# Patient Record
Sex: Female | Born: 1975 | Race: White | Hispanic: No | Marital: Single | Smoking: Never smoker
Health system: Southern US, Community
[De-identification: ages and names within clinical notes are randomized; demographics above are authoritative.]

## PROBLEM LIST (undated history)

## (undated) DIAGNOSIS — F419 Anxiety disorder, unspecified: Secondary | ICD-10-CM

## (undated) DIAGNOSIS — L709 Acne, unspecified: Secondary | ICD-10-CM

## (undated) DIAGNOSIS — S8290XA Unspecified fracture of unspecified lower leg, initial encounter for closed fracture: Secondary | ICD-10-CM

## (undated) DIAGNOSIS — M47812 Spondylosis without myelopathy or radiculopathy, cervical region: Secondary | ICD-10-CM

## (undated) HISTORY — DX: Unspecified fracture of unspecified lower leg, initial encounter for closed fracture: S82.90XA

## (undated) HISTORY — DX: Spondylosis without myelopathy or radiculopathy, cervical region: M47.812

## (undated) HISTORY — DX: Anxiety disorder, unspecified: F41.9

## (undated) HISTORY — DX: Acne, unspecified: L70.9

---

## 1997-05-05 HISTORY — PX: WISDOM TOOTH EXTRACTION: SHX21

## 2001-04-29 ENCOUNTER — Other Ambulatory Visit: Admission: RE | Admit: 2001-04-29 | Discharge: 2001-04-29 | Payer: Self-pay | Admitting: Obstetrics and Gynecology

## 2001-07-03 DIAGNOSIS — S8290XA Unspecified fracture of unspecified lower leg, initial encounter for closed fracture: Secondary | ICD-10-CM

## 2001-07-03 HISTORY — DX: Unspecified fracture of unspecified lower leg, initial encounter for closed fracture: S82.90XA

## 2001-08-27 ENCOUNTER — Encounter: Payer: Self-pay | Admitting: Family Medicine

## 2001-08-27 ENCOUNTER — Ambulatory Visit (HOSPITAL_COMMUNITY): Admission: RE | Admit: 2001-08-27 | Discharge: 2001-08-27 | Payer: Self-pay | Admitting: Family Medicine

## 2002-04-21 ENCOUNTER — Other Ambulatory Visit: Admission: RE | Admit: 2002-04-21 | Discharge: 2002-04-21 | Payer: Self-pay | Admitting: Gynecology

## 2003-04-25 ENCOUNTER — Other Ambulatory Visit: Admission: RE | Admit: 2003-04-25 | Discharge: 2003-04-25 | Payer: Self-pay | Admitting: Gynecology

## 2004-05-20 ENCOUNTER — Other Ambulatory Visit: Admission: RE | Admit: 2004-05-20 | Discharge: 2004-05-20 | Payer: Self-pay | Admitting: Gynecology

## 2005-05-21 ENCOUNTER — Other Ambulatory Visit: Admission: RE | Admit: 2005-05-21 | Discharge: 2005-05-21 | Payer: Self-pay | Admitting: Gynecology

## 2006-04-16 ENCOUNTER — Ambulatory Visit: Payer: Self-pay | Admitting: Internal Medicine

## 2006-04-16 LAB — CONVERTED CEMR LAB
BUN: 12 mg/dL (ref 6–23)
CO2: 26 meq/L (ref 19–32)
Calcium: 9.2 mg/dL (ref 8.4–10.5)
Chloride: 107 meq/L (ref 96–112)
Chol/HDL Ratio, serum: 4.1
Cholesterol: 218 mg/dL (ref 0–200)
Creatinine, Ser: 1.3 mg/dL — ABNORMAL HIGH (ref 0.4–1.2)
GFR calc non Af Amer: 51 mL/min
Glomerular Filtration Rate, Af Am: 61 mL/min/{1.73_m2}
Glucose, Bld: 82 mg/dL (ref 70–99)
HCT: 41 % (ref 36.0–46.0)
HDL: 53.1 mg/dL (ref >39.0)
Hemoglobin: 14.1 g/dL (ref 12.0–15.0)
LDL DIRECT: 162.2 mg/dL
MCHC: 34.3 g/dL (ref 30.0–36.0)
MCV: 86.5 fL (ref 78.0–100.0)
Platelets: 214 10*3/uL (ref 150–400)
Potassium: 3.8 meq/L (ref 3.5–5.1)
RBC: 4.74 M/uL (ref 3.87–5.11)
RDW: 11.9 % (ref 11.5–14.6)
Sodium: 139 meq/L (ref 135–145)
TSH: 2.17 microintl units/mL (ref 0.35–5.50)
Triglyceride fasting, serum: 74 mg/dL (ref 0–149)
VLDL: 15 mg/dL (ref 0–40)
WBC: 6.5 10*3/uL (ref 4.5–10.5)

## 2006-05-22 ENCOUNTER — Other Ambulatory Visit: Admission: RE | Admit: 2006-05-22 | Discharge: 2006-05-22 | Payer: Self-pay | Admitting: Gynecology

## 2007-05-24 ENCOUNTER — Other Ambulatory Visit: Admission: RE | Admit: 2007-05-24 | Discharge: 2007-05-24 | Payer: Self-pay | Admitting: Gynecology

## 2007-11-11 ENCOUNTER — Other Ambulatory Visit: Admission: RE | Admit: 2007-11-11 | Discharge: 2007-11-11 | Payer: Self-pay | Admitting: Gynecology

## 2008-05-24 ENCOUNTER — Encounter: Payer: Self-pay | Admitting: Women's Health

## 2008-05-24 ENCOUNTER — Other Ambulatory Visit: Admission: RE | Admit: 2008-05-24 | Discharge: 2008-05-24 | Payer: Self-pay | Admitting: Gynecology

## 2008-05-24 ENCOUNTER — Ambulatory Visit: Payer: Self-pay | Admitting: Women's Health

## 2009-05-25 ENCOUNTER — Ambulatory Visit: Payer: Self-pay | Admitting: Women's Health

## 2009-05-25 ENCOUNTER — Other Ambulatory Visit: Admission: RE | Admit: 2009-05-25 | Discharge: 2009-05-25 | Payer: Self-pay | Admitting: Gynecology

## 2010-05-30 ENCOUNTER — Ambulatory Visit
Admission: RE | Admit: 2010-05-30 | Discharge: 2010-05-30 | Payer: Self-pay | Source: Home / Self Care | Attending: Women's Health | Admitting: Women's Health

## 2010-05-30 ENCOUNTER — Other Ambulatory Visit: Payer: Self-pay | Admitting: Women's Health

## 2010-05-30 ENCOUNTER — Other Ambulatory Visit (HOSPITAL_COMMUNITY)
Admission: RE | Admit: 2010-05-30 | Discharge: 2010-05-30 | Disposition: A | Discharge status: Home / Self Care | Payer: Medicare HMO | Source: Ambulatory Visit | Attending: Gynecology | Admitting: Gynecology

## 2010-05-30 DIAGNOSIS — Z124 Encounter for screening for malignant neoplasm of cervix: Secondary | ICD-10-CM | POA: Insufficient documentation

## 2010-08-26 ENCOUNTER — Other Ambulatory Visit: Payer: Medicare HMO

## 2010-08-26 ENCOUNTER — Ambulatory Visit: Payer: Medicare HMO | Admitting: Gynecology

## 2010-10-25 ENCOUNTER — Ambulatory Visit: Payer: Medicare HMO | Admitting: Gynecology

## 2010-10-25 ENCOUNTER — Other Ambulatory Visit: Payer: Medicare HMO

## 2011-06-04 ENCOUNTER — Ambulatory Visit (INDEPENDENT_AMBULATORY_CARE_PROVIDER_SITE_OTHER): Payer: Managed Care, Other (non HMO) | Admitting: Women's Health

## 2011-06-04 ENCOUNTER — Encounter: Payer: Self-pay | Admitting: Women's Health

## 2011-06-04 ENCOUNTER — Other Ambulatory Visit: Payer: Self-pay | Admitting: Women's Health

## 2011-06-04 VITALS — BP 110/72 | Ht 63.25 in | Wt 136.0 lb

## 2011-06-04 DIAGNOSIS — B009 Herpesviral infection, unspecified: Secondary | ICD-10-CM

## 2011-06-04 DIAGNOSIS — Z01419 Encounter for gynecological examination (general) (routine) without abnormal findings: Secondary | ICD-10-CM

## 2011-06-04 DIAGNOSIS — IMO0001 Reserved for inherently not codable concepts without codable children: Secondary | ICD-10-CM

## 2011-06-04 DIAGNOSIS — Z309 Encounter for contraceptive management, unspecified: Secondary | ICD-10-CM

## 2011-06-04 LAB — URINALYSIS, ROUTINE W REFLEX MICROSCOPIC
Glucose, UA: NEGATIVE mg/dL
Leukocytes, UA: NEGATIVE
Specific Gravity, Urine: 1.01 (ref 1.005–1.030)
pH: 5.5 (ref 5.0–8.0)

## 2011-06-04 LAB — CBC WITH DIFFERENTIAL/PLATELET
Eosinophils Absolute: 0.1 10*3/uL (ref 0.0–0.7)
Hemoglobin: 13.1 g/dL (ref 12.0–15.0)
Lymphocytes Relative: 44 % (ref 12–46)
Lymphs Abs: 2.7 10*3/uL (ref 0.7–4.0)
MCH: 29.1 pg (ref 26.0–34.0)
Monocytes Relative: 7 % (ref 3–12)
Neutro Abs: 3 10*3/uL (ref 1.7–7.7)
Neutrophils Relative %: 47 % (ref 43–77)
RBC: 4.5 MIL/uL (ref 3.87–5.11)

## 2011-06-04 LAB — URINALYSIS, MICROSCOPIC ONLY
Casts: NONE SEEN
WBC, UA: NONE SEEN WBC/hpf (ref <3)

## 2011-06-04 MED ORDER — NORGESTREL-ETHINYL ESTRADIOL 0.3-30 MG-MCG PO TABS: 1.0000 | ORAL_TABLET | Freq: Every day | ORAL | Status: DC

## 2011-06-04 MED ORDER — VALACYCLOVIR HCL 500 MG PO TABS: 500.0000 mg | ORAL_TABLET | Freq: Two times a day (BID) | ORAL | Status: DC

## 2011-06-04 MED ORDER — ACYCLOVIR 5 % EX OINT: TOPICAL_OINTMENT | CUTANEOUS | Status: AC

## 2011-06-04 NOTE — Progress Notes (Signed)
Tiffany Reilly February 19, 1976 [id number]    History:    The patient presents for annual exam.  Monthly 5 day cycle on Lo/Ovral. Same partner. History of HSV with rare outbreaks. History of ASCUS with negative high-risk HPV in 2009 with normal Paps since. Complained of some increased headaches relates to stress.   Past medical history, past surgical history, family history and social history were all reviewed and documented in the EPIC chart.   ROS:  A  ROS was performed and pertinent positives and negatives are included in the history.  Exam:  Filed Vitals:   06/04/11 1509  BP: 110/72    General appearance:  Normal Head/Neck:  Normal, without cervical or supraclavicular adenopathy. Thyroid:  Symmetrical, normal in size, without palpable masses or nodularity. Respiratory  Effort:  Normal  Auscultation:  Clear without wheezing or rhonchi Cardiovascular  Auscultation:  Regular rate, without rubs, murmurs or gallops  Edema/varicosities:  Not grossly evident Abdominal  Soft,nontender, without masses, guarding or rebound.  Liver/spleen:  No organomegaly noted  Hernia:  None appreciated  Skin  Inspection:  Grossly normal  Palpation:  Grossly normal Neurologic/psychiatric  Orientation:  Normal with appropriate conversation.  Mood/affect:  Normal  Genitourinary    Breasts: Examined lying and sitting.     Right: Without masses, retractions, discharge or axillary adenopathy.     Left: Without masses, retractions, discharge or axillary adenopathy.   Inguinal/mons:  Normal without inguinal adenopathy  External genitalia:  Normal  BUS/Urethra/Skene's glands:  Normal  Bladder:  Normal  Vagina:  Normal  Cervix:  Normal  Uterus:  normal in size, shape and contour.  Midline and mobile  Adnexa/parametria:     Rt: Without masses or tenderness.   Lt: Without masses or tenderness.  Anus and perineum: Normal  Digital rectal exam: Normal sphincter tone without palpated masses or  tenderness  Assessment/Plan:  36 y.o. SWF G0 for annual exam.   Normal GYN exam on Lo/Ovral  Situational stressors HSV with rare outbreaks  Plan: Loovral prescription, proper use, slight risk for blood clots and strokes reviewed. SBE's, exercise, calcium rich diet, MVI daily encouraged. Raynelle Fanning Whitts name and number was given for counseling to deal with situational problems of job and relationship issues. Valtrex 500 by mouth twice a day when necessary, Zovirax ointment 5% when necessary prescriptions given with instructions. CBC, UAHarrington Challenger [facility], 3:56 PM 06/04/2011

## 2012-06-04 ENCOUNTER — Encounter: Payer: Self-pay | Admitting: Women's Health

## 2012-06-04 ENCOUNTER — Ambulatory Visit (INDEPENDENT_AMBULATORY_CARE_PROVIDER_SITE_OTHER): Payer: Managed Care, Other (non HMO) | Admitting: Women's Health

## 2012-06-04 VITALS — BP 116/68 | Ht 63.75 in | Wt 138.0 lb

## 2012-06-04 DIAGNOSIS — L708 Other acne: Secondary | ICD-10-CM

## 2012-06-04 DIAGNOSIS — L709 Acne, unspecified: Secondary | ICD-10-CM | POA: Insufficient documentation

## 2012-06-04 DIAGNOSIS — Z309 Encounter for contraceptive management, unspecified: Secondary | ICD-10-CM

## 2012-06-04 DIAGNOSIS — Z01419 Encounter for gynecological examination (general) (routine) without abnormal findings: Secondary | ICD-10-CM

## 2012-06-04 DIAGNOSIS — B009 Herpesviral infection, unspecified: Secondary | ICD-10-CM

## 2012-06-04 DIAGNOSIS — IMO0001 Reserved for inherently not codable concepts without codable children: Secondary | ICD-10-CM

## 2012-06-04 DIAGNOSIS — A609 Anogenital herpesviral infection, unspecified: Secondary | ICD-10-CM | POA: Insufficient documentation

## 2012-06-04 MED ORDER — NORGESTREL-ETHINYL ESTRADIOL 0.3-30 MG-MCG PO TABS: 1.0000 | ORAL_TABLET | Freq: Every day | ORAL | Status: DC

## 2012-06-04 MED ORDER — VALACYCLOVIR HCL 500 MG PO TABS: ORAL_TABLET | ORAL | Status: DC

## 2012-06-04 NOTE — Patient Instructions (Signed)

## 2012-06-04 NOTE — Progress Notes (Signed)
Tiffany Reilly 10-May-1975 [id number]    History:    The patient presents for annual exam.  Regular monthly cycle on Lo/Ovral without complaint. History of ascus with negative HR HPV 2009 with normal Paps after. 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 Same partner 5 years, undecided about children. Had elevated cholesterol, other labs normal at a health screening at work. Currently on Septra for acne per dermatologist,  Accutane in the past.   Past medical history, past surgical history, family history and social history were all reviewed and documented in the EPIC chart. 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 Healthy lifestyle, has had a heel spur which has prevented running.   ROS:  A  ROS was performed and pertinent positives and negatives are included in the history.  Exam:  Filed Vitals:   06/04/12 1524  BP: 116/68    General appearance:  Normal Head/Neck:  Normal, without cervical or supraclavicular adenopathy. Thyroid:  Symmetrical, normal in size, without palpable masses or nodularity. Respiratory  Effort:  Normal  Auscultation:  Clear without wheezing or rhonchi Cardiovascular  Auscultation:  Regular rate, without rubs, murmurs or gallops  Edema/varicosities:  Not grossly evident Abdominal  Soft,nontender, without masses, guarding or rebound.  Liver/spleen:  No organomegaly noted  Hernia:  None appreciated  Skin  Inspection:  Grossly normal  Palpation:  Grossly normal Neurologic/psychiatric  Orientation:  Normal with appropriate conversation.  Mood/affect:  Normal  Genitourinary    Breasts: Examined lying and sitting.     Right: Without masses, retractions, discharge or axillary adenopathy.     Left: Without masses, retractions, discharge or axillary adenopathy.   Inguinal/mons:  Normal without inguinal adenopathy  External genitalia:  Normal  BUS/Urethra/Skene's glands:  Normal  Bladder:  Normal  Vagina:  Normal  Cervix:   Normal  Uterus:   normal in size, shape and contour.  Midline and mobile  Adnexa/parametria:     Rt: Without masses or tenderness.   Lt: Without masses or tenderness.  Anus and perineum: Normal  Digital rectal exam: Normal sphincter tone without palpated masses or tenderness  Assessment/Plan:  37 y.o. SWF G0 for annual exam with no complaints.  Normal GYN exam on Lo ovral Rare HSV outbreaks Acne-dermatologist  Plan: Lo/Ovral prescription, proper use, slight risk for blood clots and strokes reviewed. Valtrex 500 twice daily for 3-5 days as needed prescription, proper use given and reviewed. SBE's, exercise, calcium rich diet, MVI daily encouraged. UA, Pap normal 2012, new screening guidelines reviewed. Instructed to fax labs from health screening for review.    Harrington Challenger [facility], 4:27 PM 06/04/2012

## 2013-03-10 ENCOUNTER — Other Ambulatory Visit: Payer: Self-pay

## 2013-06-08 ENCOUNTER — Encounter: Payer: Managed Care, Other (non HMO) | Admitting: Women's Health

## 2013-06-28 ENCOUNTER — Ambulatory Visit (INDEPENDENT_AMBULATORY_CARE_PROVIDER_SITE_OTHER): Payer: Managed Care, Other (non HMO) | Admitting: Women's Health

## 2013-06-28 ENCOUNTER — Encounter: Payer: Self-pay | Admitting: Women's Health

## 2013-06-28 VITALS — BP 120/80 | Ht 63.5 in | Wt 139.8 lb

## 2013-06-28 DIAGNOSIS — F418 Other specified anxiety disorders: Secondary | ICD-10-CM

## 2013-06-28 DIAGNOSIS — Z01419 Encounter for gynecological examination (general) (routine) without abnormal findings: Secondary | ICD-10-CM

## 2013-06-28 DIAGNOSIS — IMO0001 Reserved for inherently not codable concepts without codable children: Secondary | ICD-10-CM

## 2013-06-28 DIAGNOSIS — B009 Herpesviral infection, unspecified: Secondary | ICD-10-CM

## 2013-06-28 DIAGNOSIS — F341 Dysthymic disorder: Secondary | ICD-10-CM

## 2013-06-28 DIAGNOSIS — F411 Generalized anxiety disorder: Secondary | ICD-10-CM

## 2013-06-28 DIAGNOSIS — Z309 Encounter for contraceptive management, unspecified: Secondary | ICD-10-CM

## 2013-06-28 LAB — CBC WITH DIFFERENTIAL/PLATELET
Basophils Absolute: 0.1 10*3/uL (ref 0.0–0.1)
Basophils Relative: 1 % (ref 0–1)
EOS ABS: 0.2 10*3/uL (ref 0.0–0.7)
Eosinophils Relative: 2 % (ref 0–5)
HCT: 39 % (ref 36.0–46.0)
HEMOGLOBIN: 13.9 g/dL (ref 12.0–15.0)
LYMPHS ABS: 3.1 10*3/uL (ref 0.7–4.0)
Lymphocytes Relative: 37 % (ref 12–46)
MCH: 30.1 pg (ref 26.0–34.0)
MCHC: 35.6 g/dL (ref 30.0–36.0)
MCV: 84.4 fL (ref 78.0–100.0)
MONOS PCT: 6 % (ref 3–12)
Monocytes Absolute: 0.5 10*3/uL (ref 0.1–1.0)
NEUTROS ABS: 4.5 10*3/uL (ref 1.7–7.7)
NEUTROS PCT: 54 % (ref 43–77)
Platelets: 249 10*3/uL (ref 150–400)
RBC: 4.62 MIL/uL (ref 3.87–5.11)
RDW: 13.4 % (ref 11.5–15.5)
WBC: 8.3 10*3/uL (ref 4.0–10.5)

## 2013-06-28 MED ORDER — NORGESTREL-ETHINYL ESTRADIOL 0.3-30 MG-MCG PO TABS: 1.0000 | ORAL_TABLET | Freq: Every day | ORAL | Status: DC

## 2013-06-28 MED ORDER — VALACYCLOVIR HCL 500 MG PO TABS: ORAL_TABLET | ORAL | Status: DC

## 2013-06-28 MED ORDER — ALPRAZOLAM 0.25 MG PO TABS: 0.2500 mg | ORAL_TABLET | Freq: Every evening | ORAL | Status: DC | PRN

## 2013-06-28 NOTE — Patient Instructions (Signed)
Depression, Adult °Depression refers to feeling sad, low, down in the dumps, blue, gloomy, or empty. In general, there are two kinds of depression: °1. Depression that we all experience from time to time because of upsetting life experiences, including the loss of a job or the ending of a relationship (normal sadness or normal grief). This kind of depression is considered normal, is short lived, and resolves within a few days to 2 weeks. (Depression experienced after the loss of a loved one is called bereavement. Bereavement often lasts longer than 2 weeks but normally gets better with time.) °2. Clinical depression, which lasts longer than normal sadness or normal grief or interferes with your ability to function at home, at work, and in school. It also interferes with your personal relationships. It affects almost every aspect of your life. Clinical depression is an illness. °Symptoms of depression also can be caused by conditions other than normal sadness and grief or clinical depression. Examples of these conditions are listed as follows: °· Physical illness Some physical illnesses, including underactive thyroid gland (hypothyroidism), severe anemia, specific types of cancer, diabetes, uncontrolled seizures, heart and lung problems, strokes, and chronic pain are commonly associated with symptoms of depression. °· Side effects of some prescription medicine In some people, certain types of prescription medicine can cause symptoms of depression. °· Substance abuse Abuse of alcohol and illicit drugs can cause symptoms of depression. °SYMPTOMS °Symptoms of normal sadness and normal grief include the following: °· Feeling sad or crying for short periods of time. °· Not caring about anything (apathy). °· Difficulty sleeping or sleeping too much. °· No longer able to enjoy the things you used to enjoy. °· Desire to be by oneself all the time (social isolation). °· Lack of energy or motivation. °· Difficulty  concentrating or remembering. °· Change in appetite or weight. °· Restlessness or agitation. °Symptoms of clinical depression include the same symptoms of normal sadness or normal grief and also the following symptoms: °· Feeling sad or crying all the time. °· Feelings of guilt or worthlessness. °· Feelings of hopelessness or helplessness. °· Thoughts of suicide or the desire to harm yourself (suicidal ideation). °· Loss of touch with reality (psychotic symptoms). Seeing or hearing things that are not real (hallucinations) or having false beliefs about your life or the people around you (delusions and paranoia). °DIAGNOSIS  °The diagnosis of clinical depression usually is based on the severity and duration of the symptoms. Your caregiver also will ask you questions about your medical history and substance use to find out if physical illness, use of prescription medicine, or substance abuse is causing your depression. Your caregiver also may order blood tests. °TREATMENT  °Typically, normal sadness and normal grief do not require treatment. However, sometimes antidepressant medicine is prescribed for bereavement to ease the depressive symptoms until they resolve. °The treatment for clinical depression depends on the severity of your symptoms but typically includes antidepressant medicine, counseling with a mental health professional, or a combination of both. Your caregiver will help to determine what treatment is best for you. °Depression caused by physical illness usually goes away with appropriate medical treatment of the illness. If prescription medicine is causing depression, talk with your caregiver about stopping the medicine, decreasing the dose, or substituting another medicine. °Depression caused by abuse of alcohol or illicit drugs abuse goes away with abstinence from these substances. Some adults need professional help in order to stop drinking or using drugs. °SEEK IMMEDIATE CARE IF: °· You have   thoughts  about hurting yourself or others. °· You lose touch with reality (have psychotic symptoms). °· You are taking medicine for depression and have a serious side effect. °FOR MORE INFORMATION °National Alliance on Mental Illness: www.nami.org  °National Institute of Mental Health: www.nimh.nih.gov  °Document Released: 04/18/2000 Document Revised: 10/21/2011 Document Reviewed: 07/21/2011 °ExitCare® Patient Information ©2014 [employer]. °Generalized Anxiety Disorder °Generalized anxiety disorder (GAD) is a mental disorder. It interferes with life functions, including relationships, work, and school. °GAD is different from normal anxiety, which everyone experiences at some point in their lives in response to specific life events and activities. Normal anxiety actually helps us prepare for and get through these life events and activities. Normal anxiety goes away after the event or activity is over.  °GAD causes anxiety that is not necessarily related to specific events or activities. It also causes excess anxiety in proportion to specific events or activities. The anxiety associated with GAD is also difficult to control. GAD can vary from mild to severe. People with severe GAD can have intense waves of anxiety with physical symptoms (panic attacks).  °SYMPTOMS °The anxiety and worry associated with GAD are difficult to control. This anxiety and worry are related to many life events and activities and also occur more days than not for 6 months or longer. People with GAD also have three or more of the following symptoms (one or more in children): °· Restlessness.   °· Fatigue. °· Difficulty concentrating.   °· Irritability. °· Muscle tension. °· Difficulty sleeping or unsatisfying sleep. °DIAGNOSIS °GAD is diagnosed through an assessment by your caregiver. Your caregiver will ask you questions about your mood, physical symptoms, and events in your life. Your caregiver may ask you about your medical history and use of  alcohol or drugs, including prescription medications. Your caregiver may also do a physical exam and blood tests. Certain medical conditions and the use of certain substances can cause symptoms similar to those associated with GAD. Your caregiver may refer you to a mental health specialist for further evaluation. °TREATMENT °The following therapies are usually used to treat GAD:  °· Medication Antidepressant medication usually is prescribed for long-term daily control. Antianxiety medications may be added in severe cases, especially when panic attacks occur.   °· Talk therapy (psychotherapy) Certain types of talk therapy can be helpful in treating GAD by providing support, education, and guidance. A form of talk therapy called cognitive behavioral therapy can teach you healthy ways to think about and react to daily life events and activities. °· Stress management techniques These include yoga, meditation, and exercise and can be very helpful when they are practiced regularly. °A mental health specialist can help determine which treatment is best for you. Some people see improvement with one therapy. However, other people require a combination of therapies. °Document Released: 08/16/2012 Document Reviewed: 08/16/2012 °ExitCare® Patient Information ©2014 [employer]. ° °

## 2013-06-28 NOTE — Progress Notes (Signed)
Florence CannerKristina M Bechtold Sep 05, 1975 [id number]    History:    Presents for annual exam. Monthly cycle on Lo/Ovral. Ascus with negative HR HPV 2009 normal Paps after. Same partner greater than 6 years good relationship, no libido. HSV-2 rare outbreaks. States has increased anxiety, feeling depressed, not herself. Denies suicidal ideation.  Past medical history, past surgical history, family history and social history were all reviewed and documented in the EPIC chart. Works from home for [employer]. Mother, sister anxiety and depression. Desires no children.  ROS:  A  ROS was performed and pertinent positives and negatives are included.  Exam:  Filed Vitals:   06/28/13 1530  BP: 120/80    General appearance:  Normal Thyroid:  Symmetrical, normal in size, without palpable masses or nodularity. Respiratory  Auscultation:  Clear without wheezing or rhonchi Cardiovascular  Auscultation:  Regular rate, without rubs, murmurs or gallops  Edema/varicosities:  Not grossly evident Abdominal  Soft,nontender, without masses, guarding or rebound.  Liver/spleen:  No organomegaly noted  Hernia:  None appreciated  Skin  Inspection:  Grossly normal   Breasts: Examined lying and sitting.     Right: Without masses, retractions, discharge or axillary adenopathy.     Left: Without masses, retractions, discharge or axillary adenopathy. Gentitourinary   Inguinal/mons:  Normal without inguinal adenopathy  External genitalia:  Normal  BUS/Urethra/Skene's glands:  Normal  Vagina:  Normal  Cervix:  Normal  Uterus:   normal in size, shape and contour.  Midline and mobile  Adnexa/parametria:     Rt: Without masses or tenderness.   Lt: Without masses or tenderness.  Anus and perineum: Normal  Digital rectal exam: Normal sphincter tone without palpated masses or tenderness  Assessment/Plan:  38 y.o. SWF G0 for annual exam.     Anxiety/depression Normal GYN exam on Lo/Ovral HSV rare outbreaks  Plan: Raynelle FanningJulie  Whitt's  number given to call for counseling. Reviewed importance of regular exercise, leisure activities. Xanax 0.25 for anxiety/panic as needed, reviewed addictive properties, to use sparingly. Reviewed may need daily medication for anxiety and depression, to be decided after counseling. Lo/Ovral prescription, proper use, slight risk for blood clots and strokes reviewed. Other contraception reviewed, information on ParaGard and Mirena IUD given. Will check coverage and schedule with Dr. Lily PeerFernandez to place if so desires. Valtrex 500 twice daily for 3-5 days as needed prescription, proper use given and reviewed. SBE's, exercise, calcium rich diet, vitamin D 1000 daily encouraged. CBC, TSH, UA, Pap. Pap normal 2012, new screening guidelines reviewed.    Harrington ChallengerYOUNG,Geraldean Walen J Richland HsptlWHNP, 4:17 PM 06/28/2013

## 2013-06-29 ENCOUNTER — Encounter: Payer: Self-pay | Admitting: Women's Health

## 2013-06-29 ENCOUNTER — Other Ambulatory Visit (HOSPITAL_COMMUNITY)
Admission: RE | Admit: 2013-06-29 | Discharge: 2013-06-29 | Disposition: A | Discharge status: Home / Self Care | Payer: Managed Care, Other (non HMO) | Source: Ambulatory Visit | Attending: Gynecology | Admitting: Gynecology

## 2013-06-29 DIAGNOSIS — Z01419 Encounter for gynecological examination (general) (routine) without abnormal findings: Secondary | ICD-10-CM | POA: Insufficient documentation

## 2013-06-29 LAB — URINALYSIS W MICROSCOPIC + REFLEX CULTURE
Bacteria, UA: NONE SEEN
Bilirubin Urine: NEGATIVE
CASTS: NONE SEEN
Crystals: NONE SEEN
GLUCOSE, UA: NEGATIVE mg/dL
Ketones, ur: NEGATIVE mg/dL
LEUKOCYTES UA: NEGATIVE
Nitrite: NEGATIVE
PH: 5 (ref 5.0–8.0)
Protein, ur: NEGATIVE mg/dL
SPECIFIC GRAVITY, URINE: 1.02 (ref 1.005–1.030)
SQUAMOUS EPITHELIAL / LPF: NONE SEEN
Urobilinogen, UA: 0.2 mg/dL (ref 0.0–1.0)

## 2013-06-29 LAB — TSH: TSH: 2.539 u[IU]/mL (ref 0.350–4.500)

## 2013-06-29 NOTE — Addendum Note (Signed)
Addended by: Richardson ChiquitoWILKINSON, KARI S on: 06/29/2013 08:24 AM   Modules accepted: Orders

## 2013-07-22 ENCOUNTER — Telehealth: Payer: Self-pay | Admitting: *Deleted

## 2013-07-22 DIAGNOSIS — B009 Herpesviral infection, unspecified: Secondary | ICD-10-CM

## 2013-07-22 DIAGNOSIS — IMO0001 Reserved for inherently not codable concepts without codable children: Secondary | ICD-10-CM

## 2013-07-22 MED ORDER — VALACYCLOVIR HCL 500 MG PO TABS: ORAL_TABLET | ORAL | Status: DC

## 2013-07-22 MED ORDER — NORGESTREL-ETHINYL ESTRADIOL 0.3-30 MG-MCG PO TABS: 1.0000 | ORAL_TABLET | Freq: Every day | ORAL | Status: DC

## 2013-07-22 NOTE — Telephone Encounter (Signed)
Pt called requesting to have birth control pill and valtrex sent to mail order pharmacy. Also benefit checked for Mirena IUD, sent message to wendy about this

## 2013-08-01 ENCOUNTER — Telehealth: Payer: Self-pay | Admitting: Gynecology

## 2013-08-01 ENCOUNTER — Other Ambulatory Visit: Payer: Self-pay | Admitting: Gynecology

## 2013-08-01 DIAGNOSIS — Z3046 Encounter for surveillance of implantable subdermal contraceptive: Secondary | ICD-10-CM

## 2013-08-01 MED ORDER — LEVONORGESTREL 20 MCG/24HR IU IUD: INTRAUTERINE_SYSTEM | Freq: Once | INTRAUTERINE | Status: DC

## 2013-08-01 NOTE — Telephone Encounter (Signed)
08/01/13-LM VM at home that pt [employer] ins will cover the Mirena & insertion at 100%, no copay and that appt will be scheduled with JF her back up doctor with [facility]. PT to call first day of cycle. [facility]

## 2014-03-14 ENCOUNTER — Ambulatory Visit (INDEPENDENT_AMBULATORY_CARE_PROVIDER_SITE_OTHER): Payer: Managed Care, Other (non HMO) | Admitting: Family Medicine

## 2014-03-14 ENCOUNTER — Encounter: Payer: Self-pay | Admitting: Family Medicine

## 2014-03-14 VITALS — BP 112/71 | HR 98 | Ht 64.0 in | Wt 135.0 lb

## 2014-03-14 DIAGNOSIS — M5412 Radiculopathy, cervical region: Secondary | ICD-10-CM

## 2014-03-14 MED ORDER — PREDNISONE (PAK) 10 MG PO TABS: ORAL_TABLET | ORAL | Status: DC

## 2014-03-14 MED ORDER — HYDROCODONE-ACETAMINOPHEN 5-325 MG PO TABS: 1.0000 | ORAL_TABLET | Freq: Four times a day (QID) | ORAL | Status: DC | PRN

## 2014-03-14 MED ORDER — CYCLOBENZAPRINE HCL 10 MG PO TABS: 10.0000 mg | ORAL_TABLET | Freq: Three times a day (TID) | ORAL | Status: DC | PRN

## 2014-03-14 NOTE — Patient Instructions (Signed)
You have cervical radiculopathy (a pinched nerve in the neck). Prednisone 6 day dose pack to relieve irritation/inflammation of the nerve. Aleve 2 tabs twice a day with food for pain and inflammation - start day AFTER finishing prednisone if prescribed this. Flexeril three times a day as needed for muscle spasms (can make you sleepy - if so do not drive while taking this). Norco as needed for severe pain (no driving on this medicine). Consider cervical collar if severely painful. In future consider physical therapy for stretching, exercises, traction, and modalities. Heat 15 minutes at a time 3-4 times a day to help with spasms. Watch head position when on computers, texting, when sleeping in bed - should in line with back to prevent further nerve traction and irritation. If not improving we will consider an MRI. Call me in 1 week to let me know how you're doing.

## 2014-03-15 DIAGNOSIS — M5412 Radiculopathy, cervical region: Secondary | ICD-10-CM | POA: Insufficient documentation

## 2014-03-15 NOTE — Progress Notes (Addendum)
PCP: No primary care provider on file.  Subjective:   HPI: Patient is a 38 y.o. female here for right arm pain.  Patient denies known injury or trauma. States for over a month has had pain in right side of neck/shoulder with radiation down right arm. Tried ibuprofen, aleve, tylenol without benefit. Associated numbness and tingling. No bowel/bladder dysfunction.  Past Medical History  Diagnosis Date  . Asthma   . Acne   . Leg fracture 3-03    LEFT TIBIA    Current Outpatient Prescriptions on File Prior to Visit  Medication Sig Dispense Refill  . ALBUTEROL IN Inhale into the lungs.    . ALPRAZolam (XANAX) 0.25 MG tablet Take 1 tablet (0.25 mg total) by mouth at bedtime as needed for anxiety. 30 tablet 1  . CALCIUM PO Take by mouth.    . Multiple Vitamins-Minerals (MULTIVITAMIN PO) Take by mouth.    . norgestrel-ethinyl estradiol (LO/OVRAL,CRYSELLE) 0.3-30 MG-MCG tablet Take 1 tablet by mouth daily. 3 Package 3  . valACYclovir (VALTREX) 500 MG tablet Take daily 90 tablet 3   Current Facility-Administered Medications on File Prior to Visit  Medication Dose Route Frequency Provider Last Rate Last Dose  . levonorgestrel (MIRENA) 20 MCG/24HR IUD   Intrauterine Once Ok EdwardsJuan H Fernandez, MD        Past Surgical History  Procedure Laterality Date  . Wisdom tooth extraction  1999    No Known Allergies  History   Social History  . Marital Status: Single    Spouse Name: N/A    Number of Children: N/A  . Years of Education: N/A   Occupational History  . Not on file.   Social History Main Topics  . Smoking status: Never Smoker   . Smokeless tobacco: Never Used  . Alcohol Use: No  . Drug Use: No  . Sexual Activity: Not Currently    Birth Control/ Protection: Pill   Other Topics Concern  . Not on file   Social History Narrative    Family History  Problem Relation Age of Onset  . Diabetes Maternal Grandfather   . Heart disease Maternal Grandfather   . Heart attack  Maternal Grandfather     BP 112/71 mmHg  Pulse 98  Ht 5\' 4"  (1.626 m)  Wt 135 lb (61.236 kg)  BMI 23.16 kg/m2  Review of Systems: See HPI above.    Objective:  Physical Exam:  Gen: NAD  Neck: No gross deformity, swelling, bruising. TTP right cervical paraspinal region.  No midline/bony TTP. FROM neck - pain with right lateral rotation, extension and flexion. BUE strength 5/5.   Sensation intact to light touch.   2+ equal reflexes in triceps, biceps, brachioradialis tendons. Negative spurlings. NV intact distal BUEs.  Assessment & Plan:  1. Cervical radiculopathy - start with prednisone dose pack and transition to aleve.  Flexeril and norco as needed.  Heat for spasms.  Ergonomic issues discussed.  Call in 1 week to let me know how she's doing - consider MRI if not improving, PT if improving.  Addendum:  MRI reviewed and discussed with patient.  She does have prominent narrowing especially at C6-7 more on right than left - believe this is most likely the cause of her pain.  Due to combination of disc bulging and arthritis.  Discussed options - she would like to try physical therapy first before considering ESIs.  Would also like to try extended course of prednisone - sent in to her pharmacy.

## 2014-03-15 NOTE — Assessment & Plan Note (Signed)
start with prednisone dose pack and transition to aleve.  Flexeril and norco as needed.  Heat for spasms.  Ergonomic issues discussed.  Call in 1 week to let me know how she's doing - consider MRI if not improving, PT if improving.

## 2014-03-22 ENCOUNTER — Telehealth: Payer: Self-pay | Admitting: Family Medicine

## 2014-03-23 ENCOUNTER — Ambulatory Visit (HOSPITAL_BASED_OUTPATIENT_CLINIC_OR_DEPARTMENT_OTHER)
Admission: RE | Admit: 2014-03-23 | Discharge: 2014-03-23 | Disposition: A | Discharge status: Home / Self Care | Payer: Managed Care, Other (non HMO) | Source: Ambulatory Visit | Attending: Family Medicine | Admitting: Family Medicine

## 2014-03-23 DIAGNOSIS — M5032 Other cervical disc degeneration, mid-cervical region: Secondary | ICD-10-CM | POA: Diagnosis not present

## 2014-03-23 DIAGNOSIS — M542 Cervicalgia: Secondary | ICD-10-CM | POA: Diagnosis present

## 2014-03-23 DIAGNOSIS — M5412 Radiculopathy, cervical region: Secondary | ICD-10-CM

## 2014-03-23 IMAGING — CR DG CERVICAL SPINE COMPLETE 4+V
5 series · 5 of 5 positions shown · non-contrast
Comparison: None.

CLINICAL DATA: Neck pain radiating to the right shoulder and
scapula with some right hand numbness and tingling for 1 month, no
injury

EXAM:
CERVICAL SPINE  4+ VIEWS

[w c-spine lat]
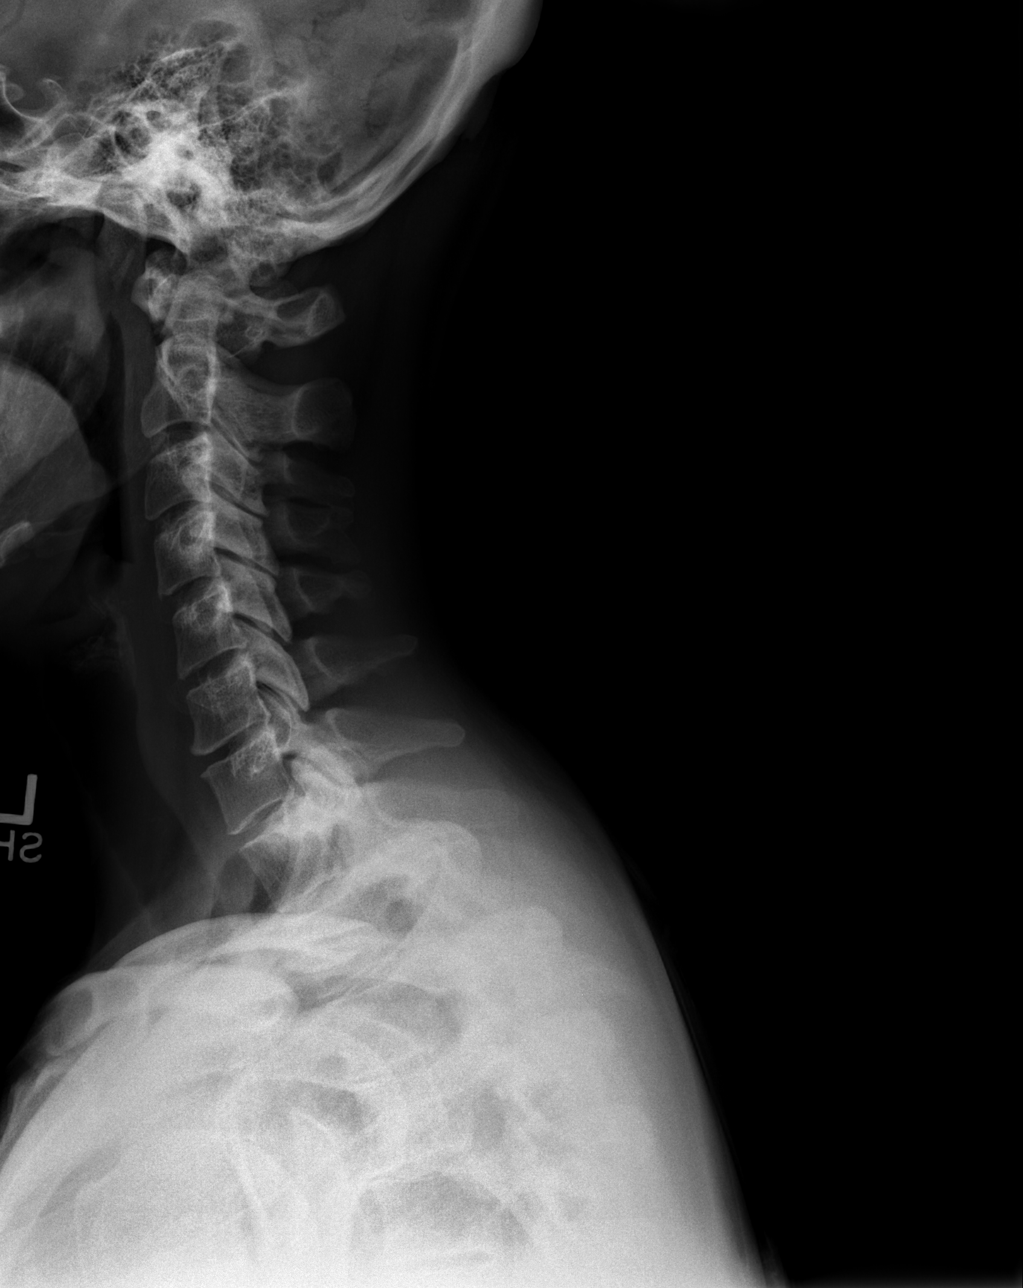

[w c-spine oblique (1 of 2)]
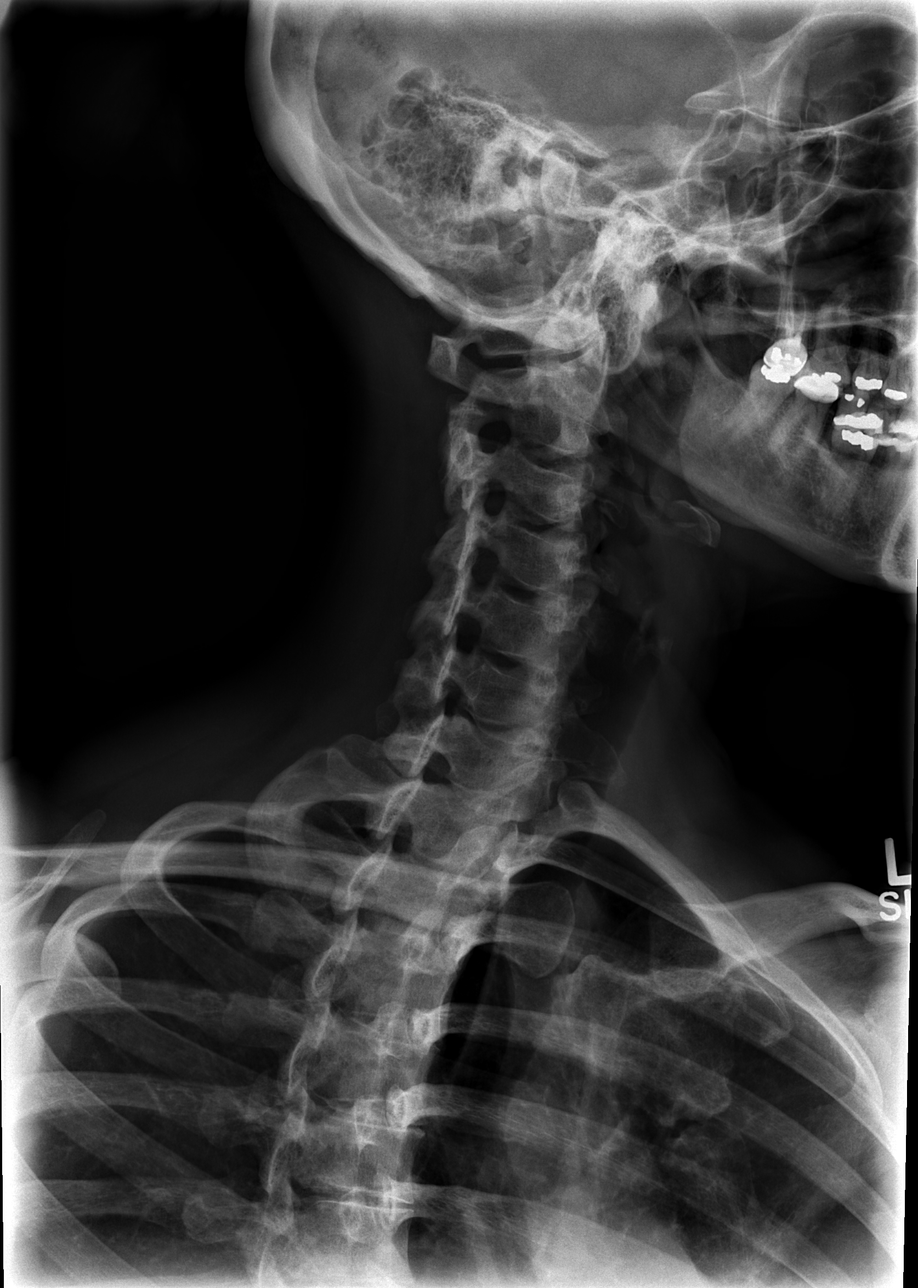

[w c-spine oblique (2 of 2)]
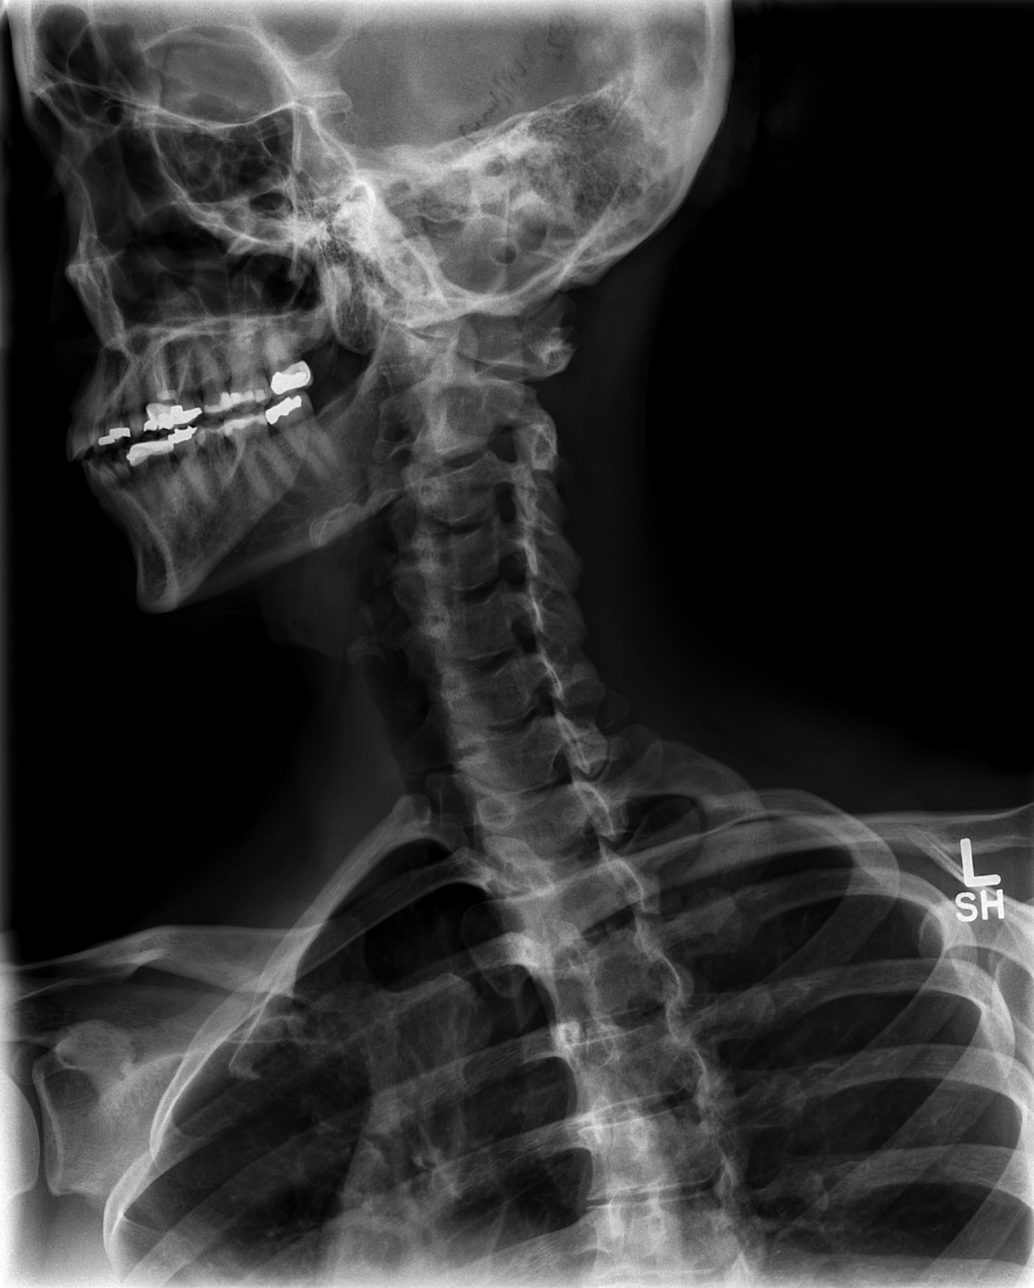

[w c-spine a.p.]
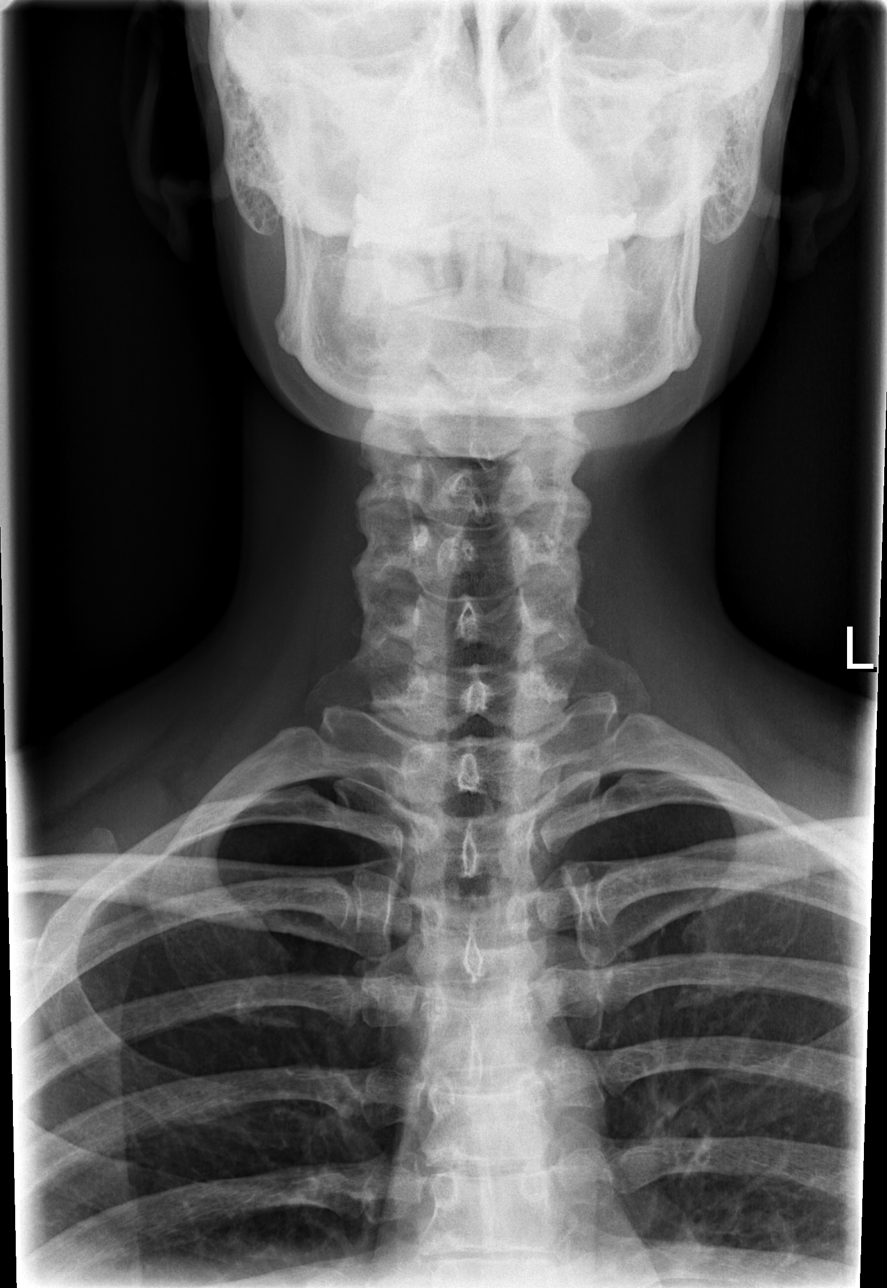

[w c-spine odontoid]
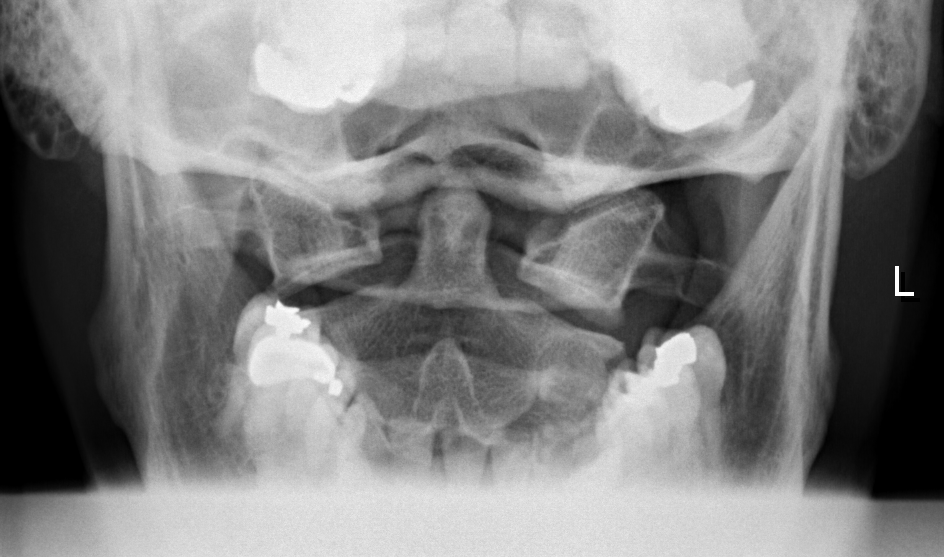

[5 of 5 positions shown; findings below may reference images not displayed]

FINDINGS: The cervical vertebrae are in normal alignment. There is mild
degenerative disc disease at C6-7 with there is slight loss of disc
space and mild sclerosis. No prevertebral soft tissue swelling is
seen. On oblique views, the foramina are patent. The odontoid
process is intact. The lung apices are clear.
IMPRESSION: Mild degenerative disc disease at C6-7.  Normal alignment.

## 2014-03-23 NOTE — Addendum Note (Signed)
Addended by: Kathi SimpersWISE, Coyt Govoni F on: 03/23/2014 11:16 AM   Modules accepted: Orders

## 2014-03-23 NOTE — Telephone Encounter (Signed)
Spoke with patient on 03-22-14 and she stated that she is only taking the Flexeril as needed. Has not taken the Norco since Sunday (03-19-14). Using heat but still having pain (6 out of 10).  Would like to know the next course of action.

## 2014-03-23 NOTE — Telephone Encounter (Signed)
Would recommend cervical spine x-rays followed by MRI of her cervical spine without contrast.  She could do physical therapy instead if she would like but that's usually if the patient is improving.

## 2014-03-24 NOTE — Addendum Note (Signed)
Addended by: Kathi SimpersWISE, Shahiem Bedwell F on: 03/24/2014 08:52 AM   Modules accepted: Orders

## 2014-03-25 ENCOUNTER — Ambulatory Visit (HOSPITAL_BASED_OUTPATIENT_CLINIC_OR_DEPARTMENT_OTHER)
Admission: RE | Admit: 2014-03-25 | Discharge: 2014-03-25 | Disposition: A | Discharge status: Home / Self Care | Payer: Managed Care, Other (non HMO) | Source: Ambulatory Visit | Attending: Family Medicine | Admitting: Family Medicine

## 2014-03-25 DIAGNOSIS — R200 Anesthesia of skin: Secondary | ICD-10-CM | POA: Diagnosis not present

## 2014-03-25 DIAGNOSIS — M542 Cervicalgia: Secondary | ICD-10-CM | POA: Insufficient documentation

## 2014-03-25 DIAGNOSIS — M47892 Other spondylosis, cervical region: Secondary | ICD-10-CM | POA: Diagnosis not present

## 2014-03-25 DIAGNOSIS — M5032 Other cervical disc degeneration, mid-cervical region: Secondary | ICD-10-CM | POA: Diagnosis not present

## 2014-03-25 DIAGNOSIS — M25511 Pain in right shoulder: Secondary | ICD-10-CM | POA: Insufficient documentation

## 2014-03-25 DIAGNOSIS — M5412 Radiculopathy, cervical region: Secondary | ICD-10-CM

## 2014-03-28 ENCOUNTER — Encounter: Payer: Self-pay | Admitting: *Deleted

## 2014-04-11 ENCOUNTER — Ambulatory Visit: Payer: Managed Care, Other (non HMO) | Admitting: Family Medicine

## 2014-04-11 MED ORDER — PREDNISONE (PAK) 10 MG PO TABS: ORAL_TABLET | ORAL | Status: DC

## 2014-04-11 NOTE — Addendum Note (Signed)
Addended by: Lenda KelpHUDNALL, Copper Kirtley R on: 04/11/2014 03:36 PM   Modules accepted: Orders, SmartSet

## 2014-04-11 NOTE — Addendum Note (Signed)
Addended by: Kathi SimpersWISE, Chavy Avera F on: 04/11/2014 03:46 PM   Modules accepted: Orders

## 2014-04-18 ENCOUNTER — Ambulatory Visit: Payer: Managed Care, Other (non HMO) | Admitting: Rehabilitation

## 2014-04-19 ENCOUNTER — Ambulatory Visit: Payer: Managed Care, Other (non HMO) | Attending: Family Medicine | Admitting: Rehabilitation

## 2014-04-19 DIAGNOSIS — M5412 Radiculopathy, cervical region: Secondary | ICD-10-CM | POA: Insufficient documentation

## 2014-04-25 ENCOUNTER — Ambulatory Visit: Payer: Managed Care, Other (non HMO) | Admitting: Rehabilitation

## 2014-04-25 DIAGNOSIS — M5412 Radiculopathy, cervical region: Secondary | ICD-10-CM | POA: Diagnosis not present

## 2014-04-27 ENCOUNTER — Ambulatory Visit: Payer: Managed Care, Other (non HMO)

## 2014-05-01 ENCOUNTER — Ambulatory Visit: Payer: Managed Care, Other (non HMO) | Admitting: Rehabilitation

## 2014-05-01 DIAGNOSIS — M5412 Radiculopathy, cervical region: Secondary | ICD-10-CM | POA: Diagnosis not present

## 2014-05-03 ENCOUNTER — Ambulatory Visit: Payer: Managed Care, Other (non HMO) | Admitting: Rehabilitation

## 2014-05-03 DIAGNOSIS — M5412 Radiculopathy, cervical region: Secondary | ICD-10-CM | POA: Diagnosis not present

## 2014-06-29 ENCOUNTER — Other Ambulatory Visit (HOSPITAL_COMMUNITY)
Admission: RE | Admit: 2014-06-29 | Discharge: 2014-06-29 | Disposition: A | Discharge status: Home / Self Care | Payer: Managed Care, Other (non HMO) | Source: Ambulatory Visit | Attending: Women's Health | Admitting: Women's Health

## 2014-06-29 ENCOUNTER — Ambulatory Visit (INDEPENDENT_AMBULATORY_CARE_PROVIDER_SITE_OTHER): Payer: Managed Care, Other (non HMO) | Admitting: Women's Health

## 2014-06-29 ENCOUNTER — Encounter: Payer: Self-pay | Admitting: Women's Health

## 2014-06-29 VITALS — BP 122/80 | Ht 63.5 in | Wt 135.0 lb

## 2014-06-29 DIAGNOSIS — Z01419 Encounter for gynecological examination (general) (routine) without abnormal findings: Secondary | ICD-10-CM | POA: Diagnosis not present

## 2014-06-29 DIAGNOSIS — Z3041 Encounter for surveillance of contraceptive pills: Secondary | ICD-10-CM

## 2014-06-29 DIAGNOSIS — B009 Herpesviral infection, unspecified: Secondary | ICD-10-CM

## 2014-06-29 DIAGNOSIS — Z1151 Encounter for screening for human papillomavirus (HPV): Secondary | ICD-10-CM | POA: Diagnosis present

## 2014-06-29 DIAGNOSIS — F411 Generalized anxiety disorder: Secondary | ICD-10-CM

## 2014-06-29 LAB — CBC WITH DIFFERENTIAL/PLATELET
Basophils Absolute: 0.1 10*3/uL (ref 0.0–0.1)
Basophils Relative: 1 % (ref 0–1)
EOS ABS: 0.1 10*3/uL (ref 0.0–0.7)
Eosinophils Relative: 1 % (ref 0–5)
HEMATOCRIT: 41 % (ref 36.0–46.0)
Hemoglobin: 13.9 g/dL (ref 12.0–15.0)
Lymphocytes Relative: 36 % (ref 12–46)
Lymphs Abs: 2.4 10*3/uL (ref 0.7–4.0)
MCH: 29.6 pg (ref 26.0–34.0)
MCHC: 33.9 g/dL (ref 30.0–36.0)
MCV: 87.4 fL (ref 78.0–100.0)
MONO ABS: 0.3 10*3/uL (ref 0.1–1.0)
MONOS PCT: 5 % (ref 3–12)
MPV: 9.4 fL (ref 8.6–12.4)
Neutro Abs: 3.9 10*3/uL (ref 1.7–7.7)
Neutrophils Relative %: 57 % (ref 43–77)
Platelets: 217 10*3/uL (ref 150–400)
RBC: 4.69 MIL/uL (ref 3.87–5.11)
RDW: 12.9 % (ref 11.5–15.5)
WBC: 6.8 10*3/uL (ref 4.0–10.5)

## 2014-06-29 MED ORDER — VALACYCLOVIR HCL 500 MG PO TABS: ORAL_TABLET | ORAL | Status: DC

## 2014-06-29 MED ORDER — ALPRAZOLAM 0.25 MG PO TABS: 0.2500 mg | ORAL_TABLET | Freq: Every evening | ORAL | Status: DC | PRN

## 2014-06-29 MED ORDER — NORGESTREL-ETHINYL ESTRADIOL 0.3-30 MG-MCG PO TABS: 1.0000 | ORAL_TABLET | Freq: Every day | ORAL | Status: DC

## 2014-06-29 NOTE — Patient Instructions (Signed)

## 2014-06-29 NOTE — Progress Notes (Signed)
Tiffany CannerKristina M Reilly 1975/09/22 [id number]    History:    Presents for annual exam.  Monthly cycle on Lo/Ovral. 2009 ascus with negative HR HPV. Desires no children. Same partner greater than 6 years. HSV-2 with rare outbreaks. Reports increased cholesterol, normal glucose at health screening at work. Struggling with anxiety.  Past medical history, past surgical history, family history and social history were all reviewed and documented in the EPIC chart. Works for [employer] from home. Mother, sister anxiety and depression.  ROS:  A ROS was performed and pertinent positives and negatives are included.  Exam:  Filed Vitals:   06/29/14 1514  BP: 122/80    General appearance:  Normal Thyroid:  Symmetrical, normal in size, without palpable masses or nodularity. Respiratory  Auscultation:  Clear without wheezing or rhonchi Cardiovascular  Auscultation:  Regular rate, without rubs, murmurs or gallops  Edema/varicosities:  Not grossly evident Abdominal  Soft,nontender, without masses, guarding or rebound.  Liver/spleen:  No organomegaly noted  Hernia:  None appreciated  Skin  Inspection:  Grossly normal   Breasts: Examined lying and sitting.     Right: Without masses, retractions, discharge or axillary adenopathy.     Left: Without masses, retractions, discharge or axillary adenopathy. Gentitourinary   Inguinal/mons:  Normal without inguinal adenopathy  External genitalia:  Normal  BUS/Urethra/Skene's glands:  Normal  Vagina:  Normal  Cervix:  Normal  Uterus:   normal in size, shape and contour.  Midline and mobile  Adnexa/parametria:     Rt: Without masses or tenderness.   Lt: Without masses or tenderness.  Anus and perineum: Normal  Digital rectal exam: Normal sphincter tone without palpated masses or tenderness  Assessment/Plan:  39 y.o. SWF G0 for annual exam.    Monthly cycle on Lo/Ovral Anxiety Reported increased cholesterol HSV 2-rare outbreaks  Plan: Lo ovral  prescription, proper use, slight risk for blood clots and strokes reviewed. Strongly encouraged counseling, Tiffany PandyJulie Reilly's name and number given, instructed to schedule counseling. Encouraged to continue regular exercise, leisure activities. Xanax 0.25 prescription, proper use, risk for addiction and overuse reviewed uses sparingly has she is less than one prescription in one year. Valtrex 500 twice daily for 3-5 days as needed, prescription, proper use given and reviewed. SBE's, annual screening mammogram at 40. Vitamin D 1000 daily encouraged. CBC, TSH, UA, Pap with HR HPV typing new screening guidelines reviewed.   Harrington ChallengerYOUNG,Tiffany Reilly J [facility] AledoWHNP, 5:13 PM 06/29/2014

## 2014-06-30 ENCOUNTER — Encounter: Payer: Self-pay | Admitting: Women's Health

## 2014-06-30 LAB — TSH: TSH: 4.301 u[IU]/mL (ref 0.350–4.500)

## 2014-07-03 LAB — CYTOLOGY - PAP

## 2014-07-04 ENCOUNTER — Encounter: Payer: Self-pay | Admitting: Women's Health

## 2014-07-04 ENCOUNTER — Telehealth: Payer: Self-pay | Admitting: *Deleted

## 2014-07-04 DIAGNOSIS — B009 Herpesviral infection, unspecified: Secondary | ICD-10-CM

## 2014-07-04 DIAGNOSIS — Z3041 Encounter for surveillance of contraceptive pills: Secondary | ICD-10-CM

## 2014-07-04 MED ORDER — VALACYCLOVIR HCL 500 MG PO TABS: ORAL_TABLET | ORAL | Status: DC

## 2014-07-04 MED ORDER — NORGESTREL-ETHINYL ESTRADIOL 0.3-30 MG-MCG PO TABS
1.0000 | ORAL_TABLET | Freq: Every day | ORAL | Status: DC
Start: 2014-07-04 — End: 2015-07-12

## 2014-07-04 NOTE — Telephone Encounter (Signed)
Pt called requesting birth control pills and valtrex sent to mail order pharmacy.

## 2015-07-03 ENCOUNTER — Encounter: Payer: Managed Care, Other (non HMO) | Admitting: Women's Health

## 2015-07-12 ENCOUNTER — Ambulatory Visit (INDEPENDENT_AMBULATORY_CARE_PROVIDER_SITE_OTHER): Payer: Managed Care, Other (non HMO) | Admitting: Women's Health

## 2015-07-12 ENCOUNTER — Encounter: Payer: Self-pay | Admitting: Women's Health

## 2015-07-12 VITALS — BP 118/74 | Ht 63.0 in | Wt 140.0 lb

## 2015-07-12 DIAGNOSIS — Z3041 Encounter for surveillance of contraceptive pills: Secondary | ICD-10-CM

## 2015-07-12 DIAGNOSIS — Z13220 Encounter for screening for lipoid disorders: Secondary | ICD-10-CM | POA: Diagnosis not present

## 2015-07-12 DIAGNOSIS — B009 Herpesviral infection, unspecified: Secondary | ICD-10-CM | POA: Diagnosis not present

## 2015-07-12 DIAGNOSIS — F411 Generalized anxiety disorder: Secondary | ICD-10-CM

## 2015-07-12 DIAGNOSIS — Z01419 Encounter for gynecological examination (general) (routine) without abnormal findings: Secondary | ICD-10-CM | POA: Diagnosis not present

## 2015-07-12 DIAGNOSIS — M542 Cervicalgia: Secondary | ICD-10-CM

## 2015-07-12 MED ORDER — NORGESTREL-ETHINYL ESTRADIOL 0.3-30 MG-MCG PO TABS: 1.0000 | ORAL_TABLET | Freq: Every day | ORAL | Status: DC

## 2015-07-12 MED ORDER — IBUPROFEN 600 MG PO TABS: 600.0000 mg | ORAL_TABLET | Freq: Three times a day (TID) | ORAL | Status: AC | PRN

## 2015-07-12 MED ORDER — VALACYCLOVIR HCL 500 MG PO TABS: ORAL_TABLET | ORAL | Status: DC

## 2015-07-12 MED ORDER — ALPRAZOLAM 0.25 MG PO TABS: 0.2500 mg | ORAL_TABLET | Freq: Every evening | ORAL | Status: DC | PRN

## 2015-07-12 MED ORDER — ESCITALOPRAM OXALATE 10 MG PO TABS: 10.0000 mg | ORAL_TABLET | Freq: Every day | ORAL | Status: DC

## 2015-07-12 NOTE — Patient Instructions (Addendum)
Health Maintenance, Female Adopting a healthy lifestyle and getting preventive care can go a long way to promote health and wellness. Talk with your health care provider about what schedule of regular examinations is right for you. This is a good chance for you to check in with your provider about disease prevention and staying healthy. In between checkups, there are plenty of things you can do on your own. Experts have done a lot of research about which lifestyle changes and preventive measures are most likely to keep you healthy. Ask your health care provider for more information. WEIGHT AND DIET  Eat a healthy diet  Be sure to include plenty of vegetables, fruits, low-fat dairy products, and lean protein.  Do not eat a lot of foods high in solid fats, added sugars, or salt.  Get regular exercise. This is one of the most important things you can do for your health.  Most adults should exercise for at least 150 minutes each week. The exercise should increase your heart rate and make you sweat (moderate-intensity exercise).  Most adults should also do strengthening exercises at least twice a week. This is in addition to the moderate-intensity exercise.  Maintain a healthy weight  Body mass index (BMI) is a measurement that can be used to identify possible weight problems. It estimates body fat based on height and weight. Your health care provider can help determine your BMI and help you achieve or maintain a healthy weight.  For females 20 years of age and older:   A BMI below 18.5 is considered underweight.  A BMI of 18.5 to 24.9 is normal.  A BMI of 25 to 29.9 is considered overweight.  A BMI of 30 and above is considered obese.  Watch levels of cholesterol and blood lipids  You should start having your blood tested for lipids and cholesterol at 41 years of age, then have this test every 5 years.  You may need to have your cholesterol levels checked more often if:  Your lipid  or cholesterol levels are high.  You are older than 41 years of age.  You are at high risk for heart disease.  CANCER SCREENING   Lung Cancer  Lung cancer screening is recommended for adults 55-80 years old who are at high risk for lung cancer because of a history of smoking.  A yearly low-dose CT scan of the lungs is recommended for people who:  Currently smoke.  Have quit within the past 15 years.  Have at least a 30-pack-year history of smoking. A pack year is smoking an average of one pack of cigarettes a day for 1 year.  Yearly screening should continue until it has been 15 years since you quit.  Yearly screening should stop if you develop a health problem that would prevent you from having lung cancer treatment.  Breast Cancer  Practice breast self-awareness. This means understanding how your breasts normally appear and feel.  It also means doing regular breast self-exams. Let your health care provider know about any changes, no matter how small.  If you are in your 20s or 30s, you should have a clinical breast exam (CBE) by a health care provider every 1-3 years as part of a regular health exam.  If you are 40 or older, have a CBE every year. Also consider having a breast X-ray (mammogram) every year.  If you have a family history of breast cancer, talk to your health care provider about genetic screening.  If you   are at high risk for breast cancer, talk to your health care provider about having an MRI and a mammogram every year.  Breast cancer gene (BRCA) assessment is recommended for women who have family members with BRCA-related cancers. BRCA-related cancers include:  Breast.  Ovarian.  Tubal.  Peritoneal cancers.  Results of the assessment will determine the need for genetic counseling and BRCA1 and BRCA2 testing. Cervical Cancer Your health care provider may recommend that you be screened regularly for cancer of the pelvic organs (ovaries, uterus, and  vagina). This screening involves a pelvic examination, including checking for microscopic changes to the surface of your cervix (Pap test). You may be encouraged to have this screening done every 3 years, beginning at age 21.  For women ages 30-65, health care providers may recommend pelvic exams and Pap testing every 3 years, or they may recommend the Pap and pelvic exam, combined with testing for human papilloma virus (HPV), every 5 years. Some types of HPV increase your risk of cervical cancer. Testing for HPV may also be done on women of any age with unclear Pap test results.  Other health care providers may not recommend any screening for nonpregnant women who are considered low risk for pelvic cancer and who do not have symptoms. Ask your health care provider if a screening pelvic exam is right for you.  If you have had past treatment for cervical cancer or a condition that could lead to cancer, you need Pap tests and screening for cancer for at least 20 years after your treatment. If Pap tests have been discontinued, your risk factors (such as having a new sexual partner) need to be reassessed to determine if screening should resume. Some women have medical problems that increase the chance of getting cervical cancer. In these cases, your health care provider may recommend more frequent screening and Pap tests. Colorectal Cancer  This type of cancer can be detected and often prevented.  Routine colorectal cancer screening usually begins at 41 years of age and continues through 41 years of age.  Your health care provider may recommend screening at an earlier age if you have risk factors for colon cancer.  Your health care provider may also recommend using home test kits to check for hidden blood in the stool.  A small camera at the end of a tube can be used to examine your colon directly (sigmoidoscopy or colonoscopy). This is done to check for the earliest forms of colorectal  cancer.  Routine screening usually begins at age 50.  Direct examination of the colon should be repeated every 5-10 years through 41 years of age. However, you may need to be screened more often if early forms of precancerous polyps or small growths are found. Skin Cancer  Check your skin from head to toe regularly.  Tell your health care provider about any new moles or changes in moles, especially if there is a change in a mole's shape or color.  Also tell your health care provider if you have a mole that is larger than the size of a pencil eraser.  Always use sunscreen. Apply sunscreen liberally and repeatedly throughout the day.  Protect yourself by wearing long sleeves, pants, a wide-brimmed hat, and sunglasses whenever you are outside. HEART DISEASE, DIABETES, AND HIGH BLOOD PRESSURE   High blood pressure causes heart disease and increases the risk of stroke. High blood pressure is more likely to develop in:  People who have blood pressure in the high end   of the normal range (130-139/85-89 mm Hg).  People who are overweight or obese.  People who are African American.  If you are 38-23 years of age, have your blood pressure checked every 3-5 years. If you are 61 years of age or older, have your blood pressure checked every year. You should have your blood pressure measured twice--once when you are at a hospital or clinic, and once when you are not at a hospital or clinic. Record the average of the two measurements. To check your blood pressure when you are not at a hospital or clinic, you can use:  An automated blood pressure machine at a pharmacy.  A home blood pressure monitor.  If you are between 45 years and 39 years old, ask your health care provider if you should take aspirin to prevent strokes.  Have regular diabetes screenings. This involves taking a blood sample to check your fasting blood sugar level.  If you are at a normal weight and have a low risk for diabetes,  have this test once every three years after 41 years of age.  If you are overweight and have a high risk for diabetes, consider being tested at a younger age or more often. PREVENTING INFECTION  Hepatitis B  If you have a higher risk for hepatitis B, you should be screened for this virus. You are considered at high risk for hepatitis B if:  You were born in a country where hepatitis B is common. Ask your health care provider which countries are considered high risk.  Your parents were born in a high-risk country, and you have not been immunized against hepatitis B (hepatitis B vaccine).  You have HIV or AIDS.  You use needles to inject street drugs.  You live with someone who has hepatitis B.  You have had sex with someone who has hepatitis B.  You get hemodialysis treatment.  You take certain medicines for conditions, including cancer, organ transplantation, and autoimmune conditions. Hepatitis C  Blood testing is recommended for:  Everyone born from [birth year] through 1966.  Anyone with known risk factors for hepatitis C. Sexually transmitted infections (STIs)  You should be screened for sexually transmitted infections (STIs) including gonorrhea and chlamydia if:  You are sexually active and are younger than 41 years of age.  You are older than 41 years of age and your health care provider tells you that you are at risk for this type of infection.  Your sexual activity has changed since you were last screened and you are at an increased risk for chlamydia or gonorrhea. Ask your health care provider if you are at risk.  If you do not have HIV, but are at risk, it may be recommended that you take a prescription medicine daily to prevent HIV infection. This is called pre-exposure prophylaxis (PrEP). You are considered at risk if:  You are sexually active and do not regularly use condoms or know the HIV status of your partner(s).  You take drugs by injection.  You are sexually  active with a partner who has HIV. Talk with your health care provider about whether you are at high risk of being infected with HIV. If you choose to begin PrEP, you should first be tested for HIV. You should then be tested every 3 months for as long as you are taking PrEP.  PREGNANCY   If you are premenopausal and you may become pregnant, ask your health care provider about preconception counseling.  If you may  become pregnant, take 400 to 800 micrograms (mcg) of folic acid every day.  If you want to prevent pregnancy, talk to your health care provider about birth control (contraception). OSTEOPOROSIS AND MENOPAUSE   Osteoporosis is a disease in which the bones lose minerals and strength with aging. This can result in serious bone fractures. Your risk for osteoporosis can be identified using a bone density scan.  If you are 107 years of age or older, or if you are at risk for osteoporosis and fractures, ask your health care provider if you should be screened.  Ask your health care provider whether you should take a calcium or vitamin D supplement to lower your risk for osteoporosis.  Menopause may have certain physical symptoms and risks.  Hormone replacement therapy may reduce some of these symptoms and risks. Talk to your health care provider about whether hormone replacement therapy is right for you.  HOME CARE INSTRUCTIONS   Schedule regular health, dental, and eye exams.  Stay current with your immunizations.   Do not use any tobacco products including cigarettes, chewing tobacco, or electronic cigarettes.  If you are pregnant, do not drink alcohol.  If you are breastfeeding, limit how much and how often you drink alcohol.  Limit alcohol intake to no more than 1 drink per day for nonpregnant women. One drink equals 12 ounces of beer, 5 ounces of wine, or 1 ounces of hard liquor.  Do not use street drugs.  Do not share needles.  Ask your health care provider for help if  you need support or information about quitting drugs.  Tell your health care provider if you often feel depressed.  Tell your health care provider if you have ever been abused or do not feel safe at home.   This information is not intended to replace advice given to you by your health care provider. Make sure you discuss any questions you have with your health care provider.   Document Released: 11/04/2010 Document Revised: 05/12/2014 Document Reviewed: 03/23/2013 Elsevier Interactive Patient Education 2016 Else[employer] Generalized Anxiety Disorder Generalized anxiety disorder (GAD) is a mental disorder. It interferes with life functions, including relationships, work, and school. GAD is different from normal anxiety, which everyone experiences at some point in their lives in response to specific life events and activities. Normal anxiety actually helps [place] prepare for and get through these life events and activities. Normal anxiety goes away after the event or activity is over.  GAD causes anxiety that is not necessarily related to specific events or activities. It also causes excess anxiety in proportion to specific events or activities. The anxiety associated with GAD is also difficult to control. GAD can vary from mild to severe. People with severe GAD can have intense waves of anxiety with physical symptoms (panic attacks).  SYMPTOMS The anxiety and worry associated with GAD are difficult to control. This anxiety and worry are related to many life events and activities and also occur more days than not for 6 months or longer. People with GAD also have three or more of the following symptoms (one or more in children):  Restlessness.   Fatigue.  Difficulty concentrating.   Irritability.  Muscle tension.  Difficulty sleeping or unsatisfying sleep. DIAGNOSIS GAD is diagnosed through an assessment by your health care provider. Your health care provider will ask you questions  aboutyour mood,physical symptoms, and events in your life. Your health care provider may ask you about your medical history and use of alcohol or  drugs, including prescription medicines. Your health care provider may also do a physical exam and blood tests. Certain medical conditions and the use of certain substances can cause symptoms similar to those associated with GAD. Your health care provider may refer you to a mental health specialist for further evaluation. TREATMENT The following therapies are usually used to treat GAD:   Medication. Antidepressant medication usually is prescribed for long-term daily control. Antianxiety medicines may be added in severe cases, especially when panic attacks occur.   Talk therapy (psychotherapy). Certain types of talk therapy can be helpful in treating GAD by providing support, education, and guidance. A form of talk therapy called cognitive behavioral therapy can teach you healthy ways to think about and react to daily life events and activities.  Stress managementtechniques. These include yoga, meditation, and exercise and can be very helpful when they are practiced regularly. A mental health specialist can help determine which treatment is best for you. Some people see improvement with one therapy. However, other people require a combination of therapies.   This information is not intended to replace advice given to you by your health care provider. Make sure you discuss any questions you have with your health care provider.   Document Released: 08/16/2012 Document Revised: 05/12/2014 Document Reviewed: 08/16/2012 Elsevier Interactive Patient Education 2016 [place]  [id number]

## 2015-07-12 NOTE — Progress Notes (Signed)
Tiffany Reilly 1975/09/11 [id number]    History:    Presents for annual exam.  Regular monthly cycle on Lo/Ovral occasional spotting week prior to cycle but much better cycle control on Lo/Ovral. Same partner years. HSV rare outbreaks. Always has had issues with anxiety has increased in the past. Related to job stress. Mother, sister with anxiety and depression. States home life is good but does stress over things that she know she should not. Will Pap history.  Past medical history, past surgical history, family history and social history were all reviewed and documented in the EPIC chart. Works for [employer].  ROS:  A ROS was performed and pertinent positives and negatives are included.  Exam:  Filed Vitals:   07/12/15 1519  BP: 118/74    General appearance:  Normal Thyroid:  Symmetrical, normal in size, without palpable masses or nodularity. Respiratory  Auscultation:  Clear without wheezing or rhonchi Cardiovascular  Auscultation:  Regular rate, without rubs, murmurs or gallops  Edema/varicosities:  Not grossly evident Abdominal  Soft,nontender, without masses, guarding or rebound.  Liver/spleen:  No organomegaly noted  Hernia:  None appreciated  Skin  Inspection:  Grossly normal   Breasts: Examined lying and sitting.     Right: Without masses, retractions, discharge or axillary adenopathy.     Left: Without masses, retractions, discharge or axillary adenopathy. Gentitourinary   Inguinal/mons:  Normal without inguinal adenopathy  External genitalia:  Normal  BUS/Urethra/Skene's glands:  Normal  Vagina:  Normal  Cervix:  Normal  Uterus:   normal in size, shape and contour.  Midline and mobile  Adnexa/parametria:     Rt: Without masses or tenderness.   Lt: Without masses or tenderness.  Anus and perineum: Normal  Digital rectal exam: Normal sphincter tone without palpated masses or tenderness  Assessment/Plan:  40 y.o. S WF G0 for annual exam.   Monthly cycle on  Lo/Ovral occasional spotting Anxiety HSV 2 rare outbreaks  Plan: Options reviewed would like to continue Lo/Ovral prescription, proper use, slight risk for blood clots and strokes reviewed. SBE's, annual screening mammogram breast center information given and reviewed instructed to schedule. Anxiety discussed, denies need for counseling would like to try a low-dose medication to help, options reviewed will try Lexapro 10 mg start with half tablet daily reviewed it is a low dose may need to increase to 20 mg daily prescription, proper use given, counseling offered again declines need. Continue regular exercise and leisure activities. CBC, CMP, TSH, lipid panel, UA, Pap normal 2016 with negative HR HPV new screenings reviewed.   Harrington ChallengerYOUNG,Mariyam Remington J [facility] LPWHNP, 5:48 PM 07/12/2015

## 2015-07-19 ENCOUNTER — Other Ambulatory Visit: Payer: Managed Care, Other (non HMO)

## 2015-12-24 ENCOUNTER — Other Ambulatory Visit: Payer: Self-pay | Admitting: *Deleted

## 2015-12-24 DIAGNOSIS — F411 Generalized anxiety disorder: Secondary | ICD-10-CM

## 2015-12-24 NOTE — Telephone Encounter (Signed)
Message left

## 2015-12-25 ENCOUNTER — Other Ambulatory Visit: Payer: Self-pay | Admitting: Women's Health

## 2015-12-25 DIAGNOSIS — F419 Anxiety disorder, unspecified: Principal | ICD-10-CM

## 2015-12-25 DIAGNOSIS — F329 Major depressive disorder, single episode, unspecified: Secondary | ICD-10-CM

## 2015-12-25 DIAGNOSIS — F32A Depression, unspecified: Secondary | ICD-10-CM

## 2015-12-25 MED ORDER — ESCITALOPRAM OXALATE 20 MG PO TABS: 20.0000 mg | ORAL_TABLET | Freq: Every day | ORAL | 2 refills | Status: DC

## 2015-12-25 NOTE — Telephone Encounter (Signed)
Message left, Lexapro 20 mg called in, received message stating doing better but think she needs a higher dose.

## 2015-12-26 NOTE — Telephone Encounter (Signed)
TC states breakup with long-term partner no infidelity Will increase Lexapro to 20 mg daily and if needed decrease to 10 mg after 6 months. Strongly encouraged counseling, continue self-care.

## 2016-02-27 ENCOUNTER — Encounter: Payer: Self-pay | Admitting: Women's Health

## 2016-07-24 ENCOUNTER — Ambulatory Visit (INDEPENDENT_AMBULATORY_CARE_PROVIDER_SITE_OTHER): Payer: Managed Care, Other (non HMO) | Admitting: Women's Health

## 2016-07-24 ENCOUNTER — Encounter: Payer: Self-pay | Admitting: Women's Health

## 2016-07-24 VITALS — BP 108/74 | Ht 63.5 in | Wt 130.6 lb

## 2016-07-24 DIAGNOSIS — F419 Anxiety disorder, unspecified: Secondary | ICD-10-CM

## 2016-07-24 DIAGNOSIS — Z3041 Encounter for surveillance of contraceptive pills: Secondary | ICD-10-CM | POA: Diagnosis not present

## 2016-07-24 DIAGNOSIS — F411 Generalized anxiety disorder: Secondary | ICD-10-CM | POA: Diagnosis not present

## 2016-07-24 DIAGNOSIS — F418 Other specified anxiety disorders: Secondary | ICD-10-CM

## 2016-07-24 DIAGNOSIS — N951 Menopausal and female climacteric states: Secondary | ICD-10-CM

## 2016-07-24 DIAGNOSIS — Z01419 Encounter for gynecological examination (general) (routine) without abnormal findings: Secondary | ICD-10-CM | POA: Diagnosis not present

## 2016-07-24 DIAGNOSIS — F329 Major depressive disorder, single episode, unspecified: Secondary | ICD-10-CM

## 2016-07-24 DIAGNOSIS — B009 Herpesviral infection, unspecified: Secondary | ICD-10-CM | POA: Diagnosis not present

## 2016-07-24 LAB — TSH: TSH: 3.35 m[IU]/L

## 2016-07-24 LAB — CBC WITH DIFFERENTIAL/PLATELET
Basophils Absolute: 0 {cells}/uL (ref 0–200)
Basophils Relative: 0 %
Eosinophils Absolute: 0 {cells}/uL — ABNORMAL LOW (ref 15–500)
Eosinophils Relative: 0 %
HCT: 40.1 % (ref 35.0–45.0)
Hemoglobin: 13.8 g/dL (ref 11.7–15.5)
Lymphocytes Relative: 40 %
Lymphs Abs: 3240 {cells}/uL (ref 850–3900)
MCH: 29.6 pg (ref 27.0–33.0)
MCHC: 34.4 g/dL (ref 32.0–36.0)
MCV: 86.1 fL (ref 80.0–100.0)
MPV: 9.7 fL (ref 7.5–12.5)
Monocytes Absolute: 405 {cells}/uL (ref 200–950)
Monocytes Relative: 5 %
Neutro Abs: 4455 {cells}/uL (ref 1500–7800)
Neutrophils Relative %: 55 %
Platelets: 204 K/uL (ref 140–400)
RBC: 4.66 MIL/uL (ref 3.80–5.10)
RDW: 13.2 % (ref 11.0–15.0)
WBC: 8.1 K/uL (ref 3.8–10.8)

## 2016-07-24 MED ORDER — ALPRAZOLAM 0.25 MG PO TABS: 0.2500 mg | ORAL_TABLET | Freq: Every evening | ORAL | 1 refills | Status: DC | PRN

## 2016-07-24 MED ORDER — NORGESTREL-ETHINYL ESTRADIOL 0.3-30 MG-MCG PO TABS: 1.0000 | ORAL_TABLET | Freq: Every day | ORAL | 4 refills | Status: DC

## 2016-07-24 MED ORDER — ESCITALOPRAM OXALATE 20 MG PO TABS: 20.0000 mg | ORAL_TABLET | Freq: Every day | ORAL | 2 refills | Status: DC

## 2016-07-24 MED ORDER — VALACYCLOVIR HCL 500 MG PO TABS: ORAL_TABLET | ORAL | 4 refills | Status: DC

## 2016-07-24 NOTE — Patient Instructions (Signed)
Health Maintenance, Female Adopting a healthy lifestyle and getting preventive care can go a long way to promote health and wellness. Talk with your health care provider about what schedule of regular examinations is right for you. This is a good chance for you to check in with your provider about disease prevention and staying healthy. In between checkups, there are plenty of things you can do on your own. Experts have done a lot of research about which lifestyle changes and preventive measures are most likely to keep you healthy. Ask your health care provider for more information. Weight and diet Eat a healthy diet  Be sure to include plenty of vegetables, fruits, low-fat dairy products, and lean protein.  Do not eat a lot of foods high in solid fats, added sugars, or salt.  Get regular exercise. This is one of the most important things you can do for your health.  Most adults should exercise for at least 150 minutes each week. The exercise should increase your heart rate and make you sweat (moderate-intensity exercise).  Most adults should also do strengthening exercises at least twice a week. This is in addition to the moderate-intensity exercise. Maintain a healthy weight  Body mass index (BMI) is a measurement that can be used to identify possible weight problems. It estimates body fat based on height and weight. Your health care provider can help determine your BMI and help you achieve or maintain a healthy weight.  For females 85 years of age and older:  A BMI below 18.5 is considered underweight.  A BMI of 18.5 to 24.9 is normal.  A BMI of 25 to 29.9 is considered overweight.  A BMI of 30 and above is considered obese. Watch levels of cholesterol and blood lipids  You should start having your blood tested for lipids and cholesterol at 42 years of age, then have this test every 5 years.  You may need to have your cholesterol levels checked more often if:  Your lipid or  cholesterol levels are high.  You are older than 42 years of age.  You are at high risk for heart disease. Cancer screening Lung Cancer  Lung cancer screening is recommended for adults 71-90 years old who are at high risk for lung cancer because of a history of smoking.  A yearly low-dose CT scan of the lungs is recommended for people who:  Currently smoke.  Have quit within the past 15 years.  Have at least a 30-pack-year history of smoking. A pack year is smoking an average of one pack of cigarettes a day for 1 year.  Yearly screening should continue until it has been 15 years since you quit.  Yearly screening should stop if you develop a health problem that would prevent you from having lung cancer treatment. Breast Cancer  Practice breast self-awareness. This means understanding how your breasts normally appear and feel.  It also means doing regular breast self-exams. Let your health care provider know about any changes, no matter how small.  If you are in your 20s or 30s, you should have a clinical breast exam (CBE) by a health care provider every 1-3 years as part of a regular health exam.  If you are 75 or older, have a CBE every year. Also consider having a breast X-ray (mammogram) every year.  If you have a family history of breast cancer, talk to your health care provider about genetic screening.  If you are at high risk for breast cancer, talk  to your health care provider about having an MRI and a mammogram every year.  Breast cancer gene (BRCA) assessment is recommended for women who have family members with BRCA-related cancers. BRCA-related cancers include:  Breast.  Ovarian.  Tubal.  Peritoneal cancers.  Results of the assessment will determine the need for genetic counseling and BRCA1 and BRCA2 testing. Cervical Cancer  Your health care provider may recommend that you be screened regularly for cancer of the pelvic organs (ovaries, uterus, and vagina).  This screening involves a pelvic examination, including checking for microscopic changes to the surface of your cervix (Pap test). You may be encouraged to have this screening done every 3 years, beginning at age 24.  For women ages 66-65, health care providers may recommend pelvic exams and Pap testing every 3 years, or they may recommend the Pap and pelvic exam, combined with testing for human papilloma virus (HPV), every 5 years. Some types of HPV increase your risk of cervical cancer. Testing for HPV may also be done on women of any age with unclear Pap test results.  Other health care providers may not recommend any screening for nonpregnant women who are considered low risk for pelvic cancer and who do not have symptoms. Ask your health care provider if a screening pelvic exam is right for you.  If you have had past treatment for cervical cancer or a condition that could lead to cancer, you need Pap tests and screening for cancer for at least 20 years after your treatment. If Pap tests have been discontinued, your risk factors (such as having a new sexual partner) need to be reassessed to determine if screening should resume. Some women have medical problems that increase the chance of getting cervical cancer. In these cases, your health care provider may recommend more frequent screening and Pap tests. Colorectal Cancer  This type of cancer can be detected and often prevented.  Routine colorectal cancer screening usually begins at 42 years of age and continues through 42 years of age.  Your health care provider may recommend screening at an earlier age if you have risk factors for colon cancer.  Your health care provider may also recommend using home test kits to check for hidden blood in the stool.  A small camera at the end of a tube can be used to examine your colon directly (sigmoidoscopy or colonoscopy). This is done to check for the earliest forms of colorectal cancer.  Routine  screening usually begins at age 41.  Direct examination of the colon should be repeated every 5-10 years through 42 years of age. However, you may need to be screened more often if early forms of precancerous polyps or small growths are found. Skin Cancer  Check your skin from head to toe regularly.  Tell your health care provider about any new moles or changes in moles, especially if there is a change in a mole's shape or color.  Also tell your health care provider if you have a mole that is larger than the size of a pencil eraser.  Always use sunscreen. Apply sunscreen liberally and repeatedly throughout the day.  Protect yourself by wearing long sleeves, pants, a wide-brimmed hat, and sunglasses whenever you are outside. Heart disease, diabetes, and high blood pressure  High blood pressure causes heart disease and increases the risk of stroke. High blood pressure is more likely to develop in:  People who have blood pressure in the high end of the normal range (130-139/85-89 mm Hg).  People who are overweight or obese.  People who are African American.  If you are 39-47 years of age, have your blood pressure checked every 3-5 years. If you are 18 years of age or older, have your blood pressure checked every year. You should have your blood pressure measured twice-once when you are at a hospital or clinic, and once when you are not at a hospital or clinic. Record the average of the two measurements. To check your blood pressure when you are not at a hospital or clinic, you can use:  An automated blood pressure machine at a pharmacy.  A home blood pressure monitor.  If you are between 19 years and 76 years old, ask your health care provider if you should take aspirin to prevent strokes.  Have regular diabetes screenings. This involves taking a blood sample to check your fasting blood sugar level.  If you are at a normal weight and have a low risk for diabetes, have this test once  every three years after 42 years of age.  If you are overweight and have a high risk for diabetes, consider being tested at a younger age or more often. Preventing infection Hepatitis B  If you have a higher risk for hepatitis B, you should be screened for this virus. You are considered at high risk for hepatitis B if:  You were born in a country where hepatitis B is common. Ask your health care provider which countries are considered high risk.  Your parents were born in a high-risk country, and you have not been immunized against hepatitis B (hepatitis B vaccine).  You have HIV or AIDS.  You use needles to inject street drugs.  You live with someone who has hepatitis B.  You have had sex with someone who has hepatitis B.  You get hemodialysis treatment.  You take certain medicines for conditions, including cancer, organ transplantation, and autoimmune conditions. Hepatitis C  Blood testing is recommended for:  Everyone born from [birth year] through 1966.  Anyone with known risk factors for hepatitis C. Sexually transmitted infections (STIs)  You should be screened for sexually transmitted infections (STIs) including gonorrhea and chlamydia if:  You are sexually active and are younger than 42 years of age.  You are older than 42 years of age and your health care provider tells you that you are at risk for this type of infection.  Your sexual activity has changed since you were last screened and you are at an increased risk for chlamydia or gonorrhea. Ask your health care provider if you are at risk.  If you do not have HIV, but are at risk, it may be recommended that you take a prescription medicine daily to prevent HIV infection. This is called pre-exposure prophylaxis (PrEP). You are considered at risk if:  You are sexually active and do not regularly use condoms or know the HIV status of your partner(s).  You take drugs by injection.  You are sexually active with a partner  who has HIV. Talk with your health care provider about whether you are at high risk of being infected with HIV. If you choose to begin PrEP, you should first be tested for HIV. You should then be tested every 3 months for as long as you are taking PrEP. Pregnancy  If you are premenopausal and you may become pregnant, ask your health care provider about preconception counseling.  If you may become pregnant, take 400 to 800 micrograms (mcg) of folic acid  every day.  If you want to prevent pregnancy, talk to your health care provider about birth control (contraception). Osteoporosis and menopause  Osteoporosis is a disease in which the bones lose minerals and strength with aging. This can result in serious bone fractures. Your risk for osteoporosis can be identified using a bone density scan.  If you are 15 years of age or older, or if you are at risk for osteoporosis and fractures, ask your health care provider if you should be screened.  Ask your health care provider whether you should take a calcium or vitamin D supplement to lower your risk for osteoporosis.  Menopause may have certain physical symptoms and risks.  Hormone replacement therapy may reduce some of these symptoms and risks. Talk to your health care provider about whether hormone replacement therapy is right for you. Follow these instructions at home:  Schedule regular health, dental, and eye exams.  Stay current with your immunizations.  Do not use any tobacco products including cigarettes, chewing tobacco, or electronic cigarettes.  If you are pregnant, do not drink alcohol.  If you are breastfeeding, limit how much and how often you drink alcohol.  Limit alcohol intake to no more than 1 drink per day for nonpregnant women. One drink equals 12 ounces of beer, 5 ounces of wine, or 1 ounces of hard liquor.  Do not use street drugs.  Do not share needles.  Ask your health care provider for help if you need support  or information about quitting drugs.  Tell your health care provider if you often feel depressed.  Tell your health care provider if you have ever been abused or do not feel safe at home. This information is not intended to replace advice given to you by your health care provider. Make sure you discuss any questions you have with your health care provider. Document Released: 11/04/2010 Document Revised: 09/27/2015 Document Reviewed: 01/23/2015 Elsevier Interactive Patient Education  2017 [employer].

## 2016-07-24 NOTE — Progress Notes (Signed)
Florence CannerKristina M Azimi 03-23-1976 [id number]    History:    Presents for annual exam. Lo/Ovral continuously. Normal Pap and mammogram history. Anxiety and depression stable on Lexapro, rare Xanax use with flying. Long-term boyfriend breakup last year denies need for STD screening.  Past medical history, past surgical history, family history and social history were all reviewed and documented in the EPIC chart. Works for [employer] from  home. Sister and mother anxiety and depression.  ROS:  A ROS was performed and pertinent positives and negatives are included.  Exam:  Vitals:   07/24/16 1507  BP: 108/74  Weight: 130 lb 9.6 oz (59.2 kg)  Height: 5' 3.5" (1.613 m)   Body mass index is 22.77 kg/m.   General appearance:  Normal Thyroid:  Symmetrical, normal in size, without palpable masses or nodularity. Respiratory  Auscultation:  Clear without wheezing or rhonchi Cardiovascular  Auscultation:  Regular rate, without rubs, murmurs or gallops  Edema/varicosities:  Not grossly evident Abdominal  Soft,nontender, without masses, guarding or rebound.  Liver/spleen:  No organomegaly noted  Hernia:  None appreciated  Skin  Inspection:  Grossly normal   Breasts: Examined lying and sitting.     Right: Without masses, retractions, discharge or axillary adenopathy.     Left: Without masses, retractions, discharge or axillary adenopathy. Gentitourinary   Inguinal/mons:  Normal without inguinal adenopathy  External genitalia:  Normal  BUS/Urethra/Skene's glands:  Normal  Vagina:  Normal  Cervix:  Normal  Uterus:  normal in size, shape and contour.  Midline and mobile  Adnexa/parametria:     Rt: Without masses or tenderness.   Lt: Without masses or tenderness.  Anus and perineum: Normal  Digital rectal exam: Normal sphincter tone without palpated masses or tenderness  Assessment/Plan:  41 y.o.SWF G0  for annual exam with complaint of always being hot, hot flushes, poor sleep.      Lo/Ovral continuously rare cycles HSV rare outbreaks Anxiety and depression stable on Lexapro  Plan: Lo/Ovral prescription, proper use, slight risk for blood clots and strokes reviewed. Condoms encouraged if sexually active. SBE's, continue annual screening mammogram, calcium rich diet, vitamin D 1000 daily encouraged. Lexapro 20 mg by mouth daily prescription, proper use given and reviewed. Strongly encouraged counseling Raynelle FanningJulie Whitt's name and number given instructed to schedule.Xanax 0.25 by mouth when necessary, uses when flying, aware to use sparingly addictive. Reviewed importance of getting back to regular exercise routine, leisure activities, self-care encouraged. Valtrex 500 by mouth daily prescription, proper use given. Reviewed periodic use declines. CBC, FSH, TSH. Had health screening labs of glucose and lipid panel will fax to office.    Harrington ChallengerYOUNG,NANCY J [facility] LLCWHNP, 5:15 PM 07/24/2016

## 2016-07-25 ENCOUNTER — Telehealth: Payer: Self-pay | Admitting: *Deleted

## 2016-07-25 LAB — FOLLICLE STIMULATING HORMONE: FSH: 3.6 m[IU]/mL

## 2016-07-25 MED ORDER — VALACYCLOVIR HCL 1 G PO TABS: ORAL_TABLET | ORAL | 3 refills | Status: DC

## 2016-07-25 NOTE — Telephone Encounter (Signed)
Rx sent 

## 2016-07-25 NOTE — Telephone Encounter (Signed)
Pharmacy states Valacyclovir 500 mg is on back order, Valacyclovir 1 gram (partially scored) . Okay to switch Rx? Please advise

## 2016-07-25 NOTE — Telephone Encounter (Signed)
Okay to send in refill for the 1 g and she will cut in half. Thanks

## 2016-07-25 NOTE — Telephone Encounter (Signed)
Message left

## 2016-09-17 ENCOUNTER — Encounter: Payer: Self-pay | Admitting: Gynecology

## 2017-07-14 ENCOUNTER — Encounter: Payer: Managed Care, Other (non HMO) | Admitting: Women's Health

## 2017-07-27 ENCOUNTER — Encounter: Payer: Managed Care, Other (non HMO) | Admitting: Women's Health

## 2017-09-23 ENCOUNTER — Ambulatory Visit (INDEPENDENT_AMBULATORY_CARE_PROVIDER_SITE_OTHER): Payer: 59 | Admitting: Women's Health

## 2017-09-23 ENCOUNTER — Encounter: Payer: Self-pay | Admitting: Women's Health

## 2017-09-23 VITALS — BP 110/70 | Ht 64.0 in | Wt 127.0 lb

## 2017-09-23 DIAGNOSIS — F419 Anxiety disorder, unspecified: Secondary | ICD-10-CM

## 2017-09-23 DIAGNOSIS — F411 Generalized anxiety disorder: Secondary | ICD-10-CM | POA: Diagnosis not present

## 2017-09-23 DIAGNOSIS — F329 Major depressive disorder, single episode, unspecified: Secondary | ICD-10-CM | POA: Diagnosis not present

## 2017-09-23 DIAGNOSIS — Z3041 Encounter for surveillance of contraceptive pills: Secondary | ICD-10-CM | POA: Diagnosis not present

## 2017-09-23 DIAGNOSIS — Z01419 Encounter for gynecological examination (general) (routine) without abnormal findings: Secondary | ICD-10-CM | POA: Diagnosis not present

## 2017-09-23 DIAGNOSIS — Z1151 Encounter for screening for human papillomavirus (HPV): Secondary | ICD-10-CM | POA: Diagnosis not present

## 2017-09-23 MED ORDER — VALACYCLOVIR HCL 500 MG PO TABS: ORAL_TABLET | ORAL | 4 refills | Status: DC

## 2017-09-23 MED ORDER — NORGESTREL-ETHINYL ESTRADIOL 0.3-30 MG-MCG PO TABS: 1.0000 | ORAL_TABLET | Freq: Every day | ORAL | 4 refills | Status: DC

## 2017-09-23 MED ORDER — ALPRAZOLAM 0.25 MG PO TABS: 0.2500 mg | ORAL_TABLET | Freq: Every evening | ORAL | 1 refills | Status: DC | PRN

## 2017-09-23 MED ORDER — ESCITALOPRAM OXALATE 20 MG PO TABS: 20.0000 mg | ORAL_TABLET | Freq: Every day | ORAL | 4 refills | Status: DC

## 2017-09-23 NOTE — Patient Instructions (Signed)

## 2017-09-23 NOTE — Progress Notes (Signed)
Tiffany Reilly 07-01-1975 [id number]    History:    Presents for annual exam. Lo/Ovral continuously/amenorrheic. Normal pap and mammogram history. Mammogram overdue.  Elevated cholesterol (per her employer health screening). Takes fish oil tablets. No boyfriend or sexual encounters for at least 2 years. Denies need for STD screening.  Desires no children.  Past medical history, past surgical history, family history and social history were all reviewed and documented in the EPIC chart. Works for [employer] from home. Anxiety and depression stable on Lexapro with occasional use of Xanax (also used for public speaking or flying). Asthma, stable. HSV managed with PRN Valtrex, no recent breakouts. Sister and mother anxiety and depression. Sister high cholesterol. Enjoys frequent walks with her 43 year old Yorkie named [place].  ROS:  A ROS was performed and pertinent positives and negatives are included.  Exam:  Vitals:   09/23/17 1401  BP: 110/70  Weight: 127 lb (57.6 kg)  Height:  (1.626 m)   Body mass index is 21.8 kg/m.   General appearance:  Normal Thyroid:  Symmetrical, normal in size, without palpable masses or nodularity. Respiratory  Auscultation:  Clear without wheezing or rhonchi Cardiovascular  Auscultation:  Regular rate, without rubs, murmurs or gallops  Edema/varicosities:  Not grossly evident Abdominal  Soft,nontender, without masses, guarding or rebound.  Liver/spleen:  No organomegaly noted  Hernia:  None appreciated  Skin  Inspection:  Grossly normal   Breasts: Examined lying and sitting.     Right: Without masses, retractions, discharge or axillary adenopathy.     Left: Without masses, retractions, discharge or axillary adenopathy. Gentitourinary   Inguinal/mons:  Normal without inguinal adenopathy  External genitalia:  Normal  BUS/Urethra/Skene's glands:  Normal  Vagina:  Normal  Cervix:  Normal  Uterus:  normal in size, shape and contour.  Midline  and mobile  Adnexa/parametria:     Rt: Without masses or tenderness.   Lt: Without masses or tenderness.  Anus and perineum: Normal  Digital rectal exam: Normal sphincter tone without palpated masses or tenderness  Assessment/Plan:  42 y.o. SWF G0 for annual exam with no complaints. Appears well.  Lo/Ovral continuously rare cycles HSV rare breakouts Anxiety and depression stable on Lexapro and occasional Xanax Hyperlipidemia Labs-health screening at work  Plan: Lo/Ovral prescription, proper use, slight risk for blood clots and strokes reviewed. Lexapro 20 mg prescription, proper use reviewed. Vatrex 500 mg prescription, tablet twice daily for 3 to 5 days proper use given. Xanax 0.25 mg prescription, prn/proper use, use when flying, public speakiing, or increased anxiety, aware of addictive potential. Condoms encouraged if sexually active. SBE's, continue annual screening mammogram, calcium rich diet, vitamin D 1000 daily encouraged. Continue exercise and diet.  Provide office with copy of lab results. Continue fish oil tablets and discussed red yeast rice for hyperlipidemia. Schedule mammogram.  Pap with HR HPV typing, new screening guidelines reviewed.  Harrington Challenger [facility], 2:46 PM 09/23/2017

## 2017-09-24 LAB — PAP, TP IMAGING W/ HPV RNA, RFLX HPV TYPE 16,18/45: HPV DNA High Risk: NOT DETECTED

## 2018-03-04 ENCOUNTER — Encounter: Payer: Self-pay | Admitting: Women's Health

## 2018-09-29 ENCOUNTER — Encounter: Payer: 59 | Admitting: Women's Health

## 2018-10-01 ENCOUNTER — Other Ambulatory Visit: Payer: Self-pay

## 2018-10-04 ENCOUNTER — Encounter: Payer: Self-pay | Admitting: Women's Health

## 2018-10-04 ENCOUNTER — Other Ambulatory Visit: Payer: Self-pay

## 2018-10-04 ENCOUNTER — Ambulatory Visit (INDEPENDENT_AMBULATORY_CARE_PROVIDER_SITE_OTHER): Payer: 59 | Admitting: Women's Health

## 2018-10-04 VITALS — BP 122/80 | Ht 64.0 in | Wt 128.0 lb

## 2018-10-04 DIAGNOSIS — Z01411 Encounter for gynecological examination (general) (routine) with abnormal findings: Secondary | ICD-10-CM | POA: Diagnosis not present

## 2018-10-04 DIAGNOSIS — F329 Major depressive disorder, single episode, unspecified: Secondary | ICD-10-CM

## 2018-10-04 DIAGNOSIS — Z3041 Encounter for surveillance of contraceptive pills: Secondary | ICD-10-CM | POA: Diagnosis not present

## 2018-10-04 DIAGNOSIS — R51 Headache: Secondary | ICD-10-CM | POA: Diagnosis not present

## 2018-10-04 DIAGNOSIS — R519 Headache, unspecified: Secondary | ICD-10-CM | POA: Insufficient documentation

## 2018-10-04 DIAGNOSIS — Z01419 Encounter for gynecological examination (general) (routine) without abnormal findings: Secondary | ICD-10-CM

## 2018-10-04 DIAGNOSIS — F411 Generalized anxiety disorder: Secondary | ICD-10-CM | POA: Diagnosis not present

## 2018-10-04 DIAGNOSIS — F32A Depression, unspecified: Secondary | ICD-10-CM

## 2018-10-04 DIAGNOSIS — Z113 Encounter for screening for infections with a predominantly sexual mode of transmission: Secondary | ICD-10-CM

## 2018-10-04 MED ORDER — NORGESTREL-ETHINYL ESTRADIOL 0.3-30 MG-MCG PO TABS: 1.0000 | ORAL_TABLET | Freq: Every day | ORAL | 4 refills | Status: DC

## 2018-10-04 MED ORDER — VALACYCLOVIR HCL 500 MG PO TABS: ORAL_TABLET | ORAL | 4 refills | Status: DC

## 2018-10-04 MED ORDER — ESCITALOPRAM OXALATE 20 MG PO TABS: 20.0000 mg | ORAL_TABLET | Freq: Every day | ORAL | 4 refills | Status: DC

## 2018-10-04 MED ORDER — SUMATRIPTAN SUCCINATE 25 MG PO TABS: 25.0000 mg | ORAL_TABLET | ORAL | 1 refills | Status: DC | PRN

## 2018-10-04 MED ORDER — ALPRAZOLAM 0.25 MG PO TABS: 0.2500 mg | ORAL_TABLET | Freq: Every evening | ORAL | 3 refills | Status: DC | PRN

## 2018-10-04 NOTE — Patient Instructions (Signed)

## 2018-10-04 NOTE — Progress Notes (Signed)
Tiffany Reilly 06-Jan-1976 [id number]    History:    Presents for annual exam.  Monthly cycle on Lo/Ovral .  Normal Pap and mammogram history.  New partner.  On Lexapro doing better with anxiety and depression.  HSV no outbreaks.  Has always had headaches but has had more frequent in the past year denies migraines with aura.  Headaches random, 2 weekly, taking frequent ibuprofen and Tylenol.  Past medical history, past surgical history, family history and social history were all reviewed and documented in the EPIC chart.  Works for [employer] job.  Has a Yorkie  [place] who barks at all dogs.  Sister and mother anxiety and depression.  ROS:  A ROS was performed and pertinent positives and negatives are included.  Exam:  Vitals:   10/04/18 1534  BP: 122/80  Weight: 128 lb (58.1 kg)  Height: 5\' 4"  (1.626 m)   Body mass index is 21.97 kg/m.   General appearance:  Normal Thyroid:  Symmetrical, normal in size, without palpable masses or nodularity. Respiratory  Auscultation:  Clear without wheezing or rhonchi Cardiovascular  Auscultation:  Regular rate, without rubs, murmurs or gallops  Edema/varicosities:  Not grossly evident Abdominal  Soft,nontender, without masses, guarding or rebound.  Liver/spleen:  No organomegaly noted  Hernia:  None appreciated  Skin  Inspection:  Grossly normal   Breasts: Examined lying and sitting.     Right: Without masses, retractions, discharge or axillary adenopathy.     Left: Without masses, retractions, discharge or axillary adenopathy. Gentitourinary   Inguinal/mons:  Normal without inguinal adenopathy  External genitalia:  Normal  BUS/Urethra/Skene's glands:  Normal  Vagina:  Normal  Cervix:  Normal  Uterus:  normal in size, shape and contour.  Midline and mobile  Adnexa/parametria:     Rt: Without masses or tenderness.   Lt: Without masses or tenderness.  Anus and perineum: Normal  Digital rectal exam: Normal sphincter tone  without palpated masses or tenderness  Assessment/Plan:  43 y.o. S WF G0 for annual exam with no GYN complaints.  Monthly cycle on Lo ovral HSV no outbreaks Anxiety and depression stable on Lexapro STD screen  Plan: Lo Ovral prescription, proper use, slight risk for blood clots and strokes reviewed.  Encouraged condoms until permanent partner.  SBEs, continue annual 3D screening mammogram, calcium rich foods, MVI daily, continue regular exercise encouraged.  Lexapro 20 mg p.o. daily prescription, proper use given and reviewed.  Counseling as needed has had in the past.  Valtrex 500 twice daily for 3 to 5 days as needed prescription, proper use given and reviewed.  Encouraged to follow-up with primary care for frequent headaches, will try Imitrex 25 mg at onset of headache and repeat 1 tablet in 2 hours but no more than 2 tablets in a 24-hour period.  Verbalized understanding.  CBC, CMP, GC/chlamydia, HIV, RPR.  Pap normal 2019, new screening guidelines reviewed.    Harrington Challenger [facility], 4:55 PM 10/04/2018

## 2018-10-05 LAB — CBC WITH DIFFERENTIAL/PLATELET
Absolute Monocytes: 429 cells/uL (ref 200–950)
Basophils Absolute: 81 cells/uL (ref 0–200)
Basophils Relative: 1 %
Eosinophils Absolute: 57 cells/uL (ref 15–500)
Eosinophils Relative: 0.7 %
HCT: 35.9 % (ref 35.0–45.0)
Hemoglobin: 12.2 g/dL (ref 11.7–15.5)
Lymphs Abs: 2981 cells/uL (ref 850–3900)
MCH: 30 pg (ref 27.0–33.0)
MCHC: 34 g/dL (ref 32.0–36.0)
MCV: 88.2 fL (ref 80.0–100.0)
MPV: 10.3 fL (ref 7.5–12.5)
Monocytes Relative: 5.3 %
Neutro Abs: 4552 cells/uL (ref 1500–7800)
Neutrophils Relative %: 56.2 %
Platelets: 220 10*3/uL (ref 140–400)
RBC: 4.07 10*6/uL (ref 3.80–5.10)
RDW: 12.1 % (ref 11.0–15.0)
Total Lymphocyte: 36.8 %
WBC: 8.1 10*3/uL (ref 3.8–10.8)

## 2018-10-05 LAB — C. TRACHOMATIS/N. GONORRHOEAE RNA
C. trachomatis RNA, TMA: NOT DETECTED
N. gonorrhoeae RNA, TMA: NOT DETECTED

## 2018-10-05 LAB — COMPREHENSIVE METABOLIC PANEL
AG Ratio: 1.7 (calc) (ref 1.0–2.5)
ALT: 9 U/L (ref 6–29)
AST: 14 U/L (ref 10–30)
Albumin: 4.1 g/dL (ref 3.6–5.1)
Alkaline phosphatase (APISO): 39 U/L (ref 31–125)
BUN: 13 mg/dL (ref 7–25)
CO2: 26 mmol/L (ref 20–32)
Calcium: 9.3 mg/dL (ref 8.6–10.2)
Chloride: 105 mmol/L (ref 98–110)
Creat: 1.02 mg/dL (ref 0.50–1.10)
Globulin: 2.4 g/dL (calc) (ref 1.9–3.7)
Glucose, Bld: 79 mg/dL (ref 65–99)
Potassium: 3.8 mmol/L (ref 3.5–5.3)
Sodium: 138 mmol/L (ref 135–146)
Total Bilirubin: 0.3 mg/dL (ref 0.2–1.2)
Total Protein: 6.5 g/dL (ref 6.1–8.1)

## 2018-10-05 LAB — RPR: RPR Ser Ql: NONREACTIVE

## 2018-10-05 LAB — HIV ANTIBODY (ROUTINE TESTING W REFLEX): HIV 1&2 Ab, 4th Generation: NONREACTIVE

## 2018-10-06 LAB — URINALYSIS, COMPLETE W/RFL CULTURE
Bacteria, UA: NONE SEEN /HPF
Bilirubin Urine: NEGATIVE
Glucose, UA: NEGATIVE
Hyaline Cast: NONE SEEN /LPF
Ketones, ur: NEGATIVE
Leukocyte Esterase: NEGATIVE
Nitrites, Initial: NEGATIVE
Protein, ur: NEGATIVE
Specific Gravity, Urine: 1.01 (ref 1.001–1.03)
WBC, UA: NONE SEEN /HPF (ref 0–5)
pH: <=5 (ref 5.0–8.0)

## 2018-10-06 LAB — URINE CULTURE
MICRO NUMBER:: 528142
SPECIMEN QUALITY:: ADEQUATE

## 2018-10-06 LAB — CULTURE INDICATED

## 2019-04-19 ENCOUNTER — Other Ambulatory Visit: Payer: Self-pay

## 2019-04-19 MED ORDER — VALACYCLOVIR HCL 500 MG PO TABS: ORAL_TABLET | ORAL | 3 refills | Status: DC

## 2019-10-04 ENCOUNTER — Other Ambulatory Visit: Payer: Self-pay

## 2019-10-05 ENCOUNTER — Ambulatory Visit (INDEPENDENT_AMBULATORY_CARE_PROVIDER_SITE_OTHER): Payer: No Typology Code available for payment source | Admitting: Nurse Practitioner

## 2019-10-05 ENCOUNTER — Encounter: Payer: Self-pay | Admitting: Nurse Practitioner

## 2019-10-05 VITALS — BP 124/80 | Ht 64.0 in | Wt 128.0 lb

## 2019-10-05 DIAGNOSIS — B009 Herpesviral infection, unspecified: Secondary | ICD-10-CM

## 2019-10-05 DIAGNOSIS — F32A Depression, unspecified: Secondary | ICD-10-CM

## 2019-10-05 DIAGNOSIS — Z113 Encounter for screening for infections with a predominantly sexual mode of transmission: Secondary | ICD-10-CM

## 2019-10-05 DIAGNOSIS — F329 Major depressive disorder, single episode, unspecified: Secondary | ICD-10-CM

## 2019-10-05 DIAGNOSIS — R519 Headache, unspecified: Secondary | ICD-10-CM

## 2019-10-05 DIAGNOSIS — Z01419 Encounter for gynecological examination (general) (routine) without abnormal findings: Secondary | ICD-10-CM | POA: Diagnosis not present

## 2019-10-05 DIAGNOSIS — Z3041 Encounter for surveillance of contraceptive pills: Secondary | ICD-10-CM

## 2019-10-05 DIAGNOSIS — F419 Anxiety disorder, unspecified: Secondary | ICD-10-CM

## 2019-10-05 MED ORDER — SUMATRIPTAN SUCCINATE 25 MG PO TABS: 25.0000 mg | ORAL_TABLET | ORAL | 1 refills | Status: DC | PRN

## 2019-10-05 MED ORDER — ESCITALOPRAM OXALATE 20 MG PO TABS: 20.0000 mg | ORAL_TABLET | Freq: Every day | ORAL | 4 refills | Status: DC

## 2019-10-05 MED ORDER — ALPRAZOLAM 0.25 MG PO TABS: 0.2500 mg | ORAL_TABLET | Freq: Every evening | ORAL | 1 refills | Status: DC | PRN

## 2019-10-05 MED ORDER — NORGESTREL-ETHINYL ESTRADIOL 0.3-30 MG-MCG PO TABS: 1.0000 | ORAL_TABLET | Freq: Every day | ORAL | 4 refills | Status: DC

## 2019-10-05 NOTE — Patient Instructions (Signed)
Health Maintenance, Female Adopting a healthy lifestyle and getting preventive care are important in promoting health and wellness. Ask your health care provider about:  The right schedule for you to have regular tests and exams.  Things you can do on your own to prevent diseases and keep yourself healthy. What should I know about diet, weight, and exercise? Eat a healthy diet   Eat a diet that includes plenty of vegetables, fruits, low-fat dairy products, and lean protein.  Do not eat a lot of foods that are high in solid fats, added sugars, or sodium. Maintain a healthy weight Body mass index (BMI) is used to identify weight problems. It estimates body fat based on height and weight. Your health care provider can help determine your BMI and help you achieve or maintain a healthy weight. Get regular exercise Get regular exercise. This is one of the most important things you can do for your health. Most adults should:  Exercise for at least 150 minutes each week. The exercise should increase your heart rate and make you sweat (moderate-intensity exercise).  Do strengthening exercises at least twice a week. This is in addition to the moderate-intensity exercise.  Spend less time sitting. Even light physical activity can be beneficial. Watch cholesterol and blood lipids Have your blood tested for lipids and cholesterol at 45 years of age, then have this test every 5 years. Have your cholesterol levels checked more often if:  Your lipid or cholesterol levels are high.  You are older than 45 years of age.  You are at high risk for heart disease. What should I know about cancer screening? Depending on your health history and family history, you may need to have cancer screening at various ages. This may include screening for:  Breast cancer.  Cervical cancer.  Colorectal cancer.  Skin cancer.  Lung cancer. What should I know about heart disease, diabetes, and high blood  pressure? Blood pressure and heart disease  High blood pressure causes heart disease and increases the risk of stroke. This is more likely to develop in people who have high blood pressure readings, are of African descent, or are overweight.  Have your blood pressure checked: ? Every 3-5 years if you are 18-39 years of age. ? Every year if you are 40 years old or older. Diabetes Have regular diabetes screenings. This checks your fasting blood sugar level. Have the screening done:  Once every three years after age 40 if you are at a normal weight and have a low risk for diabetes.  More often and at a younger age if you are overweight or have a high risk for diabetes. What should I know about preventing infection? Hepatitis B If you have a higher risk for hepatitis B, you should be screened for this virus. Talk with your health care provider to find out if you are at risk for hepatitis B infection. Hepatitis C Testing is recommended for:  Everyone born from 1945 through 1965.  Anyone with known risk factors for hepatitis C. Sexually transmitted infections (STIs)  Get screened for STIs, including gonorrhea and chlamydia, if: ? You are sexually active and are younger than 45 years of age. ? You are older than 45 years of age and your health care provider tells you that you are at risk for this type of infection. ? Your sexual activity has changed since you were last screened, and you are at increased risk for chlamydia or gonorrhea. Ask your health care provider if   you are at risk.  Ask your health care provider about whether you are at high risk for HIV. Your health care provider may recommend a prescription medicine to help prevent HIV infection. If you choose to take medicine to prevent HIV, you should first get tested for HIV. You should then be tested every 3 months for as long as you are taking the medicine. Pregnancy  If you are about to stop having your period (premenopausal) and  you may become pregnant, seek counseling before you get pregnant.  Take 400 to 800 micrograms (mcg) of folic acid every day if you become pregnant.  Ask for birth control (contraception) if you want to prevent pregnancy. Osteoporosis and menopause Osteoporosis is a disease in which the bones lose minerals and strength with aging. This can result in bone fractures. If you are 65 years old or older, or if you are at risk for osteoporosis and fractures, ask your health care provider if you should:  Be screened for bone loss.  Take a calcium or vitamin D supplement to lower your risk of fractures.  Be given hormone replacement therapy (HRT) to treat symptoms of menopause. Follow these instructions at home: Lifestyle  Do not use any products that contain nicotine or tobacco, such as cigarettes, e-cigarettes, and chewing tobacco. If you need help quitting, ask your health care provider.  Do not use street drugs.  Do not share needles.  Ask your health care provider for help if you need support or information about quitting drugs. Alcohol use  Do not drink alcohol if: ? Your health care provider tells you not to drink. ? You are pregnant, may be pregnant, or are planning to become pregnant.  If you drink alcohol: ? Limit how much you use to 0-1 drink a day. ? Limit intake if you are breastfeeding.  Be aware of how much alcohol is in your drink. In the [place] one drink equals one 12 oz bottle of beer (355 mL), one 5 oz glass of wine (148 mL), or one 1 oz glass of hard liquor (44 mL). General instructions  Schedule regular health, dental, and eye exams.  Stay current with your vaccines.  Tell your health care provider if: ? You often feel depressed. ? You have ever been abused or do not feel safe at home. Summary  Adopting a healthy lifestyle and getting preventive care are important in promoting health and wellness.  Follow your health care provider's instructions about healthy  diet, exercising, and getting tested or screened for diseases.  Follow your health care provider's instructions on monitoring your cholesterol and blood pressure. This information is not intended to replace advice given to you by your health care provider. Make sure you discuss any questions you have with your health care provider. Document Revised: 04/14/2018 Document Reviewed: 04/14/2018 Elsevier Patient Education  2020 [employer]  

## 2019-10-05 NOTE — Progress Notes (Signed)
   Tiffany Reilly [birth year] [id number]   History:  44 y.o. G0 presents for annual exam without GYN complaints.  Monthly cycle on OCPs.  Normal Pap and mammogram history.  History of HSV, occasional outbreaks, last one 6 months ago.  Same partner for 2 years but wants STD testing today.  History of frequent headaches, was prescribed Imitrex last year and does well on this, also takes ibuprofen and Tylenol.  Started on Lexapro last year for depression and anxiety, she now takes 1 every other day and wants to wean slowly.  Takes Xanax as needed for anxiety, rarely takes.  Gynecologic History Patient's last menstrual period was 09/21/2019. Period Cycle (Days): 28 Period Duration (Days): 5 Period Pattern: Regular Menstrual Flow: Moderate Menstrual Control: Maxi pad Dysmenorrhea: (!) Mild Dysmenorrhea Symptoms: Cramping Contraception: OCP (estrogen/progesterone) Last Pap: 09/23/2017. Results were: normal Last mammogram: 03/04/2018. Results were: normal   Past medical history, past surgical history, family history and social history were all reviewed and documented in the EPIC chart.  ROS:  A ROS was performed and pertinent positives and negatives are included.  Exam:  Vitals:   10/05/19 1527  BP: 124/80  Weight: 128 lb (58.1 kg)  Height: 5\' 4"  (1.626 m)   Body mass index is 21.97 kg/m.  General appearance:  Normal Thyroid:  Symmetrical, normal in size, without palpable masses or nodularity. Respiratory  Auscultation:  Clear without wheezing or rhonchi Cardiovascular  Auscultation:  Regular rate, without rubs, murmurs or gallops  Edema/varicosities:  Not grossly evident Abdominal  Soft,nontender, without masses, guarding or rebound.  Liver/spleen:  No organomegaly noted  Hernia:  None appreciated  Skin  Inspection:  Grossly normal   Breasts: Examined lying and sitting.   Right: Without masses, retractions, discharge or axillary adenopathy.   Left: Without masses,  retractions, discharge or axillary adenopathy. Gentitourinary   Inguinal/mons:  Normal without inguinal adenopathy  External genitalia:  Normal, hard small cyst-like bump at posterior introitus, no evidence of infection  BUS/Urethra/Skene's glands:  Normal  Vagina:  Normal  Cervix:  Normal  Uterus:  Normal in size, shape and contour.  Midline and mobile  Adnexa/parametria:     Rt: Without masses or tenderness.   Lt: Without masses or tenderness.  Anus and perineum: Normal  Digital rectal exam: Normal sphincter tone without palpated masses or tenderness  Assessment/Plan:  44 y.o.  for annual exam.   Well female exam with routine gynecological exam - Plan: CBC with Differential/Platelet, Comprehensive metabolic panel. Education provided on SBEs, importance of preventative screenings, current guidelines, high calcium diet, regular exercise, and multivitamin daily.  Is past due for mammogram. She normally does this with the mobile unit at work but it did not come this year due to [facility] but she will schedule soon.  HSV-1 infection - occasional outbreaks, Valtrex as needed  Screen for STD (sexually transmitted disease) - Plan: HIV Antibody (routine testing w rflx), RPR, C. trachomatis/N. gonorrhoeae RNA  Anxiety and depression - Plan: escitalopram (LEXAPRO) 20 MG tablet, ALPRAZolam (XANAX) 0.25 MG tablet.   Encounter for surveillance of contraceptive pills - Plan: norgestrel-ethinyl estradiol (LO/OVRAL) 0.3-30 MG-MCG tablet  Frequent headaches - Plan: SUMAtriptan (IMITREX) 25 MG tablet  Follow-up in 1 year for annual    07-28-1984 [facility], 3:33 PM 10/05/2019

## 2019-10-06 LAB — COMPREHENSIVE METABOLIC PANEL
AG Ratio: 1.9 (calc) (ref 1.0–2.5)
ALT: 17 U/L (ref 6–29)
AST: 22 U/L (ref 10–30)
Albumin: 4.3 g/dL (ref 3.6–5.1)
Alkaline phosphatase (APISO): 57 U/L (ref 31–125)
BUN/Creatinine Ratio: 12 (calc) (ref 6–22)
BUN: 13 mg/dL (ref 7–25)
CO2: 28 mmol/L (ref 20–32)
Calcium: 9.4 mg/dL (ref 8.6–10.2)
Chloride: 105 mmol/L (ref 98–110)
Creat: 1.13 mg/dL — ABNORMAL HIGH (ref 0.50–1.10)
Globulin: 2.3 g/dL (calc) (ref 1.9–3.7)
Glucose, Bld: 82 mg/dL (ref 65–99)
Potassium: 3.9 mmol/L (ref 3.5–5.3)
Sodium: 140 mmol/L (ref 135–146)
Total Bilirubin: 0.3 mg/dL (ref 0.2–1.2)
Total Protein: 6.6 g/dL (ref 6.1–8.1)

## 2019-10-06 LAB — RPR: RPR Ser Ql: NONREACTIVE

## 2019-10-06 LAB — CBC WITH DIFFERENTIAL/PLATELET
Absolute Monocytes: 409 cells/uL (ref 200–950)
Basophils Absolute: 73 cells/uL (ref 0–200)
Basophils Relative: 1.1 %
Eosinophils Absolute: 73 cells/uL (ref 15–500)
Eosinophils Relative: 1.1 %
HCT: 37.3 % (ref 35.0–45.0)
Hemoglobin: 12.7 g/dL (ref 11.7–15.5)
Lymphs Abs: 2218 cells/uL (ref 850–3900)
MCH: 30 pg (ref 27.0–33.0)
MCHC: 34 g/dL (ref 32.0–36.0)
MCV: 88 fL (ref 80.0–100.0)
MPV: 9.9 fL (ref 7.5–12.5)
Monocytes Relative: 6.2 %
Neutro Abs: 3828 cells/uL (ref 1500–7800)
Neutrophils Relative %: 58 %
Platelets: 200 10*3/uL (ref 140–400)
RBC: 4.24 10*6/uL (ref 3.80–5.10)
RDW: 12.6 % (ref 11.0–15.0)
Total Lymphocyte: 33.6 %
WBC: 6.6 10*3/uL (ref 3.8–10.8)

## 2019-10-06 LAB — HIV ANTIBODY (ROUTINE TESTING W REFLEX): HIV 1&2 Ab, 4th Generation: NONREACTIVE

## 2019-10-06 LAB — C. TRACHOMATIS/N. GONORRHOEAE RNA
C. trachomatis RNA, TMA: NOT DETECTED
N. gonorrhoeae RNA, TMA: NOT DETECTED

## 2019-10-06 MED ORDER — NORGESTREL-ETHINYL ESTRADIOL 0.3-30 MG-MCG PO TABS: 1.0000 | ORAL_TABLET | Freq: Every day | ORAL | 4 refills | Status: DC

## 2019-10-06 MED ORDER — ESCITALOPRAM OXALATE 20 MG PO TABS: 20.0000 mg | ORAL_TABLET | Freq: Every day | ORAL | 4 refills | Status: DC

## 2019-10-06 NOTE — Addendum Note (Signed)
Addended by: Tito Dine on: 10/06/2019 08:54 AM   Modules accepted: Orders

## 2019-10-13 ENCOUNTER — Other Ambulatory Visit: Payer: Self-pay

## 2019-10-13 MED ORDER — VALACYCLOVIR HCL 500 MG PO TABS: ORAL_TABLET | ORAL | 3 refills | Status: DC

## 2020-10-08 ENCOUNTER — Other Ambulatory Visit: Payer: Self-pay

## 2020-10-08 ENCOUNTER — Encounter: Payer: Self-pay | Admitting: Nurse Practitioner

## 2020-10-08 ENCOUNTER — Ambulatory Visit (INDEPENDENT_AMBULATORY_CARE_PROVIDER_SITE_OTHER): Payer: No Typology Code available for payment source | Admitting: Nurse Practitioner

## 2020-10-08 VITALS — BP 110/70 | Ht 63.0 in | Wt 130.0 lb

## 2020-10-08 DIAGNOSIS — Z01419 Encounter for gynecological examination (general) (routine) without abnormal findings: Secondary | ICD-10-CM | POA: Diagnosis not present

## 2020-10-08 DIAGNOSIS — R519 Headache, unspecified: Secondary | ICD-10-CM | POA: Diagnosis not present

## 2020-10-08 DIAGNOSIS — F419 Anxiety disorder, unspecified: Secondary | ICD-10-CM | POA: Diagnosis not present

## 2020-10-08 DIAGNOSIS — R61 Generalized hyperhidrosis: Secondary | ICD-10-CM

## 2020-10-08 DIAGNOSIS — B009 Herpesviral infection, unspecified: Secondary | ICD-10-CM | POA: Diagnosis not present

## 2020-10-08 DIAGNOSIS — Z113 Encounter for screening for infections with a predominantly sexual mode of transmission: Secondary | ICD-10-CM

## 2020-10-08 DIAGNOSIS — Z3041 Encounter for surveillance of contraceptive pills: Secondary | ICD-10-CM | POA: Diagnosis not present

## 2020-10-08 MED ORDER — VALACYCLOVIR HCL 500 MG PO TABS: ORAL_TABLET | ORAL | 3 refills | Status: DC

## 2020-10-08 MED ORDER — SUMATRIPTAN SUCCINATE 25 MG PO TABS: 25.0000 mg | ORAL_TABLET | ORAL | 1 refills | Status: DC | PRN

## 2020-10-08 MED ORDER — ALPRAZOLAM 0.25 MG PO TABS: 0.2500 mg | ORAL_TABLET | Freq: Every evening | ORAL | 1 refills | Status: DC | PRN

## 2020-10-08 MED ORDER — NORGESTREL-ETHINYL ESTRADIOL 0.3-30 MG-MCG PO TABS: 1.0000 | ORAL_TABLET | Freq: Every day | ORAL | 3 refills | Status: DC

## 2020-10-08 NOTE — Patient Instructions (Addendum)
Schedule mammogram! [facility] [phone] [address] [place], [place] [address]  Health Maintenance, Female Adopting a healthy lifestyle and getting preventive care are important in promoting health and wellness. Ask your health care provider about:  The right schedule for you to have regular tests and exams.  Things you can do on your own to prevent diseases and keep yourself healthy. What should I know about diet, weight, and exercise? Eat a healthy diet  Eat a diet that includes plenty of vegetables, fruits, low-fat dairy products, and lean protein.  Do not eat a lot of foods that are high in solid fats, added sugars, or sodium.   Maintain a healthy weight Body mass index (BMI) is used to identify weight problems. It estimates body fat based on height and weight. Your health care provider can help determine your BMI and help you achieve or maintain a healthy weight. Get regular exercise Get regular exercise. This is one of the most important things you can do for your health. Most adults should:  Exercise for at least 150 minutes each week. The exercise should increase your heart rate and make you sweat (moderate-intensity exercise).  Do strengthening exercises at least twice a week. This is in addition to the moderate-intensity exercise.  Spend less time sitting. Even light physical activity can be beneficial. Watch cholesterol and blood lipids Have your blood tested for lipids and cholesterol at 46 years of age, then have this test every 5 years. Have your cholesterol levels checked more often if:  Your lipid or cholesterol levels are high.  You are older than 46 years of age.  You are at high risk for heart disease. What should I know about cancer screening? Depending on your health history and family history, you may need to have cancer screening at various ages. This may include screening for:  Breast cancer.  Cervical cancer.  Colorectal cancer.  Skin  cancer.  Lung cancer. What should I know about heart disease, diabetes, and high blood pressure? Blood pressure and heart disease  High blood pressure causes heart disease and increases the risk of stroke. This is more likely to develop in people who have high blood pressure readings, are of African descent, or are overweight.  Have your blood pressure checked: ? Every 3-5 years if you are 60-74 years of age. ? Every year if you are 47 years old or older. Diabetes Have regular diabetes screenings. This checks your fasting blood sugar level. Have the screening done:  Once every three years after age 4 if you are at a normal weight and have a low risk for diabetes.  More often and at a younger age if you are overweight or have a high risk for diabetes. What should I know about preventing infection? Hepatitis B If you have a higher risk for hepatitis B, you should be screened for this virus. Talk with your health care provider to find out if you are at risk for hepatitis B infection. Hepatitis C Testing is recommended for:  Everyone born from [birth year] through 1965.  Anyone with known risk factors for hepatitis C. Sexually transmitted infections (STIs)  Get screened for STIs, including gonorrhea and chlamydia, if: ? You are sexually active and are younger than 46 years of age. ? You are older than 46 years of age and your health care provider tells you that you are at risk for this type of infection. ? Your sexual activity has changed since you were last screened, and  you are at increased risk for chlamydia or gonorrhea. Ask your health care provider if you are at risk.  Ask your health care provider about whether you are at high risk for HIV. Your health care provider may recommend a prescription medicine to help prevent HIV infection. If you choose to take medicine to prevent HIV, you should first get tested for HIV. You should then be tested every 3 months for as long as you are taking  the medicine. Pregnancy  If you are about to stop having your period (premenopausal) and you may become pregnant, seek counseling before you get pregnant.  Take 400 to 800 micrograms (mcg) of folic acid every day if you become pregnant.  Ask for birth control (contraception) if you want to prevent pregnancy. Osteoporosis and menopause Osteoporosis is a disease in which the bones lose minerals and strength with aging. This can result in bone fractures. If you are 58 years old or older, or if you are at risk for osteoporosis and fractures, ask your health care provider if you should:  Be screened for bone loss.  Take a calcium or vitamin D supplement to lower your risk of fractures.  Be given hormone replacement therapy (HRT) to treat symptoms of menopause. Follow these instructions at home: Lifestyle  Do not use any products that contain nicotine or tobacco, such as cigarettes, e-cigarettes, and chewing tobacco. If you need help quitting, ask your health care provider.  Do not use street drugs.  Do not share needles.  Ask your health care provider for help if you need support or information about quitting drugs. Alcohol use  Do not drink alcohol if: ? Your health care provider tells you not to drink. ? You are pregnant, may be pregnant, or are planning to become pregnant.  If you drink alcohol: ? Limit how much you use to 0-1 drink a day. ? Limit intake if you are breastfeeding.  Be aware of how much alcohol is in your drink. In the [place] one drink equals one 12 oz bottle of beer (355 mL), one 5 oz glass of wine (148 mL), or one 1 oz glass of hard liquor (44 mL). General instructions  Schedule regular health, dental, and eye exams.  Stay current with your vaccines.  Tell your health care provider if: ? You often feel depressed. ? You have ever been abused or do not feel safe at home. Summary  Adopting a healthy lifestyle and getting preventive care are important in  promoting health and wellness.  Follow your health care provider's instructions about healthy diet, exercising, and getting tested or screened for diseases.  Follow your health care provider's instructions on monitoring your cholesterol and blood pressure. This information is not intended to replace advice given to you by your health care provider. Make sure you discuss any questions you have with your health care provider. Document Revised: 04/14/2018 Document Reviewed: 04/14/2018 Elsevier Patient Education  2021 [employer].

## 2020-10-08 NOTE — Progress Notes (Signed)
Tiffany Reilly 09-Nov-1975 [id number]   History:  45 y.o. G0 presents for annual exam. Monthly cycle on OCPs. She has started having night sweats since last summer. Normal pap and mammogram history. History of HSV, occasional outbreaks. Sexually active occasionally with same partner but is unsure of infidelity. Currently weaning off of Lexapro and doing well. Takes xanax occasionally to help with insomnia due to anxiety. Imitrex as needed for headaches, does not have to take often.   Gynecologic History Patient's last menstrual period was 09/17/2020. Period Cycle (Days): 28 Period Duration (Days): 5 Period Pattern: Regular Menstrual Flow: Moderate Dysmenorrhea: (!) Mild Dysmenorrhea Symptoms: Cramping Contraception/Family planning: OCP (estrogen/progesterone)  Health Maintenance Last Pap: 09/23/2017. Results were: normal Last mammogram: 2019. Results were: normal Last colonoscopy: Never Last Dexa: Not indicated  Past medical history, past surgical history, family history and social history were all reviewed and documented in the EPIC chart. Single. Works from home for [employer].   ROS:  A ROS was performed and pertinent positives and negatives are included.  Exam:  Vitals:   10/08/20 1535  BP: 110/70  Weight: 130 lb (59 kg)  Height: 5\' 3"  (1.6 m)   Body mass index is 23.03 kg/m.  General appearance:  Normal Thyroid:  Symmetrical, normal in size, without palpable masses or nodularity. Respiratory  Auscultation:  Clear without wheezing or rhonchi Cardiovascular  Auscultation:  Regular rate, without rubs, murmurs or gallops  Edema/varicosities:  Not grossly evident Abdominal  Soft,nontender, without masses, guarding or rebound.  Liver/spleen:  No organomegaly noted  Hernia:  None appreciated  Skin  Inspection:  Grossly normal Breasts: Examined lying and sitting.   Right: Without masses, retractions, nipple discharge or axillary adenopathy.   Left: Without masses,  retractions, nipple discharge or axillary adenopathy. Genitourinary   Inguinal/mons:  Normal without inguinal adenopathy  External genitalia:  Normal appearing vulva with no masses, tenderness, or lesions  BUS/Urethra/Skene's glands:  Normal  Vagina:  Normal appearing with normal color and discharge, no lesions  Cervix:  Normal appearing without discharge or lesions  Uterus:  Normal in size, shape and contour.  Midline and mobile, nontender  Adnexa/parametria:     Rt: Normal in size, without masses or tenderness.   Lt: Normal in size, without masses or tenderness.  Anus and perineum: Normal  Digital rectal exam: Normal sphincter tone without palpated masses or tenderness  Assessment/Plan:  45 y.o. G0 for annual exam.   Well female exam with routine gynecological exam - Plan: CBC with Differential/Platelet, Comprehensive metabolic panel Education provided on SBEs, importance of preventative screenings, current guidelines, high calcium diet, regular exercise, and multivitamin daily.   Encounter for surveillance of contraceptive pills - Plan: norgestrel-ethinyl estradiol (LO/OVRAL) 0.3-30 MG-MCG tablet daily. Taking as prescribed. Refill x 1 year provided.   Night sweats - Plan: TSH. Likely due to perimenopause but we will rule out thyroid dysfunction. We talked about what to expect with perimenopause and management of night sweats.   HSV (herpes simplex virus) infection - Plan: valACYclovir (VALTREX) 500 MG tablet. 2-3 outbreaks per year. Has been taking Valtrex Mondays and Fridays and then increasing if breakout occurs. Recommend just taking as needed only.  Screen for STD (sexually transmitted disease) - Plan: SURESWAB CT/NG/T. vaginalis  Frequent headaches - Plan: SUMAtriptan (IMITREX) 25 MG tablet as needed. Does not use often.   Anxiety - Plan: ALPRAZolam (XANAX) 0.25 MG tablet as needed for insomnia due to anxiety. Takes rarely.   Screening for cervical cancer - Normal  Pap history.   Will repeat at 5-year interval per guidelines.  Screening for breast cancer - Normal mammogram history.Overdue for mammogram. She used the mobile unit through work prior to the pandemic. Information provided on [facility] and encouraged to schedule soon.  Normal breast exam today.  Return in 1 year for annual.      Tiffany Mackie DNP, 3:51 PM 10/08/2020

## 2020-10-09 LAB — CBC WITH DIFFERENTIAL/PLATELET
Absolute Monocytes: 429 cells/uL (ref 200–950)
Basophils Absolute: 72 cells/uL (ref 0–200)
Basophils Relative: 1.1 %
Eosinophils Absolute: 98 cells/uL (ref 15–500)
Eosinophils Relative: 1.5 %
HCT: 36.8 % (ref 35.0–45.0)
Hemoglobin: 12.5 g/dL (ref 11.7–15.5)
Lymphs Abs: 2405 cells/uL (ref 850–3900)
MCH: 29.9 pg (ref 27.0–33.0)
MCHC: 34 g/dL (ref 32.0–36.0)
MCV: 88 fL (ref 80.0–100.0)
MPV: 10.6 fL (ref 7.5–12.5)
Monocytes Relative: 6.6 %
Neutro Abs: 3497 cells/uL (ref 1500–7800)
Neutrophils Relative %: 53.8 %
Platelets: 213 10*3/uL (ref 140–400)
RBC: 4.18 10*6/uL (ref 3.80–5.10)
RDW: 12.7 % (ref 11.0–15.0)
Total Lymphocyte: 37 %
WBC: 6.5 10*3/uL (ref 3.8–10.8)

## 2020-10-09 LAB — COMPREHENSIVE METABOLIC PANEL
AG Ratio: 1.6 (calc) (ref 1.0–2.5)
ALT: 11 U/L (ref 6–29)
AST: 17 U/L (ref 10–35)
Albumin: 3.9 g/dL (ref 3.6–5.1)
Alkaline phosphatase (APISO): 39 U/L (ref 31–125)
BUN: 12 mg/dL (ref 7–25)
CO2: 28 mmol/L (ref 20–32)
Calcium: 8.8 mg/dL (ref 8.6–10.2)
Chloride: 106 mmol/L (ref 98–110)
Creat: 0.96 mg/dL (ref 0.50–1.10)
Globulin: 2.5 g/dL (calc) (ref 1.9–3.7)
Glucose, Bld: 80 mg/dL (ref 65–99)
Potassium: 4.1 mmol/L (ref 3.5–5.3)
Sodium: 140 mmol/L (ref 135–146)
Total Bilirubin: 0.3 mg/dL (ref 0.2–1.2)
Total Protein: 6.4 g/dL (ref 6.1–8.1)

## 2020-10-09 LAB — TSH: TSH: 1.69 mIU/L

## 2020-10-09 LAB — SURESWAB CT/NG/T. VAGINALIS
C. trachomatis RNA, TMA: NOT DETECTED
N. gonorrhoeae RNA, TMA: NOT DETECTED
Trichomonas vaginalis RNA: NOT DETECTED

## 2020-10-31 ENCOUNTER — Other Ambulatory Visit: Payer: Self-pay | Admitting: Nurse Practitioner

## 2020-10-31 DIAGNOSIS — Z3041 Encounter for surveillance of contraceptive pills: Secondary | ICD-10-CM

## 2020-11-01 NOTE — Telephone Encounter (Signed)
Rx was sent on 10/08/20, pharmacy sent refill requesting for new Rx with Dx for pills. Rx sent with Dx code attached.

## 2021-01-02 ENCOUNTER — Other Ambulatory Visit: Payer: Self-pay | Admitting: Nurse Practitioner

## 2021-01-02 DIAGNOSIS — F32A Depression, unspecified: Secondary | ICD-10-CM

## 2021-01-02 DIAGNOSIS — F419 Anxiety disorder, unspecified: Secondary | ICD-10-CM

## 2021-01-03 NOTE — Telephone Encounter (Signed)
Per note on 10/08/20 "weaning off Lexapro"

## 2021-10-09 ENCOUNTER — Ambulatory Visit: Payer: No Typology Code available for payment source | Admitting: Nurse Practitioner

## 2021-10-15 ENCOUNTER — Encounter: Payer: Self-pay | Admitting: Nurse Practitioner

## 2021-10-15 ENCOUNTER — Ambulatory Visit (INDEPENDENT_AMBULATORY_CARE_PROVIDER_SITE_OTHER): Payer: No Typology Code available for payment source | Admitting: Nurse Practitioner

## 2021-10-15 VITALS — BP 114/70 | Ht 63.0 in | Wt 136.0 lb

## 2021-10-15 DIAGNOSIS — F419 Anxiety disorder, unspecified: Secondary | ICD-10-CM

## 2021-10-15 DIAGNOSIS — G43009 Migraine without aura, not intractable, without status migrainosus: Secondary | ICD-10-CM

## 2021-10-15 DIAGNOSIS — Z3041 Encounter for surveillance of contraceptive pills: Secondary | ICD-10-CM | POA: Diagnosis not present

## 2021-10-15 DIAGNOSIS — B009 Herpesviral infection, unspecified: Secondary | ICD-10-CM

## 2021-10-15 DIAGNOSIS — Z01419 Encounter for gynecological examination (general) (routine) without abnormal findings: Secondary | ICD-10-CM | POA: Diagnosis not present

## 2021-10-15 MED ORDER — SUMATRIPTAN SUCCINATE 50 MG PO TABS: 50.0000 mg | ORAL_TABLET | ORAL | 0 refills | Status: DC | PRN

## 2021-10-15 MED ORDER — VALACYCLOVIR HCL 500 MG PO TABS: ORAL_TABLET | ORAL | 3 refills | Status: DC

## 2021-10-15 MED ORDER — LOW-OGESTREL 0.3-30 MG-MCG PO TABS: 1.0000 | ORAL_TABLET | Freq: Every day | ORAL | 3 refills | Status: DC

## 2021-10-15 MED ORDER — ESCITALOPRAM OXALATE 10 MG PO TABS: 10.0000 mg | ORAL_TABLET | Freq: Every day | ORAL | 3 refills | Status: DC

## 2021-10-15 NOTE — Progress Notes (Signed)
[place] 1975-12-10 [id number]   History:  46 y.o. G0 presents for annual exam. Monthly cycle on OCPs. Normal pap and mammogram history. History of HSV, occasional outbreaks. Takes xanax occasionally to help with insomnia due to anxiety. Tried to stop Lexapro last year but restarted and taking half the dose (10 mg).Takes Imitrex for migraines but does not feel this is helping as much. Headaches range in frequency from 1-2 per week to one every 1-2 months. They last 24 hours and are not debilitating, no auras. She only takes 1 dose of Imitrex.   Gynecologic History Patient's last menstrual period was 10/05/2021. Period Cycle (Days): 28 Period Duration (Days): 5 Period Pattern: Regular Menstrual Flow: Moderate Dysmenorrhea: (!) Moderate Dysmenorrhea Symptoms: Cramping Contraception/Family planning: OCP (estrogen/progesterone) Sexually active: No  Health Maintenance Last Pap: 09/23/2017. Results were: Normal, 5-year repeat Last mammogram: 10/09/2020. Results were: Normal Last colonoscopy: Never Last Dexa: Not indicated  Past medical history, past surgical history, family history and social history were all reviewed and documented in the EPIC chart. Single. Works from home for [employer]. Has 47 yo yorkie.   ROS:  A ROS was performed and pertinent positives and negatives are included.  Exam:  Vitals:   10/15/21 1521  BP: 114/70  Weight: 136 lb (61.7 kg)  Height: 5\' 3"  (1.6 m)    Body mass index is 24.09 kg/m.  General appearance:  Normal Thyroid:  Symmetrical, normal in size, without palpable masses or nodularity. Respiratory  Auscultation:  Clear without wheezing or rhonchi Cardiovascular  Auscultation:  Regular rate, without rubs, murmurs or gallops  Edema/varicosities:  Not grossly evident Abdominal  Soft,nontender, without masses, guarding or rebound.  Liver/spleen:  No organomegaly noted  Hernia:  None appreciated  Skin  Inspection:  Grossly normal Breasts:  Examined lying and sitting.   Right: Without masses, retractions, nipple discharge or axillary adenopathy.   Left: Without masses, retractions, nipple discharge or axillary adenopathy. Genitourinary   Inguinal/mons:  Normal without inguinal adenopathy  External genitalia:  Normal appearing vulva with no masses, tenderness, or lesions  BUS/Urethra/Skene's glands:  Normal  Vagina:  Normal appearing with normal color and discharge, no lesions  Cervix:  Normal appearing without discharge or lesions  Uterus:  Normal in size, shape and contour.  Midline and mobile, nontender  Adnexa/parametria:     Rt: Normal in size, without masses or tenderness.   Lt: Normal in size, without masses or tenderness.  Anus and perineum: Normal  Digital rectal exam: Normal sphincter tone without palpated masses or tenderness  Patient informed chaperone available to be present for breast and pelvic exam. Patient has requested no chaperone to be present. Patient has been advised what will be completed during breast and pelvic exam.   Assessment/Plan:  46 y.o. G0 for annual exam.   Well female exam with routine gynecological exam - Plan: CBC with Differential/Platelet, Comprehensive metabolic panel, Lipid panel. Education provided on SBEs, importance of preventative screenings, current guidelines, high calcium diet, regular exercise, and multivitamin daily. Will return for fasting labs.   Encounter for surveillance of contraceptive pills - Plan: norgestrel-ethinyl estradiol (LOW-OGESTREL) 0.3-30 MG-MCG tablet daily. Taking as prescribed. Refill x 1 year provided.   Anxiety - Plan: escitalopram (LEXAPRO) 10 MG tablet daily. Tried to stop taking last year but restarted. Taking half of a 20 mg tablet. New prescription for 10 mg provided.   Migraine without aura and without status migrainosus, not intractable - Plan: SUMAtriptan (IMITREX) 50 MG tablet at first sign  of headache. May repeat in 2 hours if persists or recurs.  Takes Imitrex for migraines but does not feel this is helping as much. Headaches range in frequency from 1-2 per week to one every 1-2 months. They last 24 hours and are not debilitating, no auras. She only takes 1 dose of Imitrex. Will increase from 25 to 50 mg. Recommend taking second dose if needed since she has been only taking one. Recommend neurology referral if headaches are not managed or occur more frequently.   HSV (herpes simplex virus) infection - Plan: valACYclovir (VALTREX) 500 MG tablet as needed for outbreaks.   Screening for cervical cancer - Normal Pap history.  Will repeat at 5-year interval per guidelines.  Screening for breast cancer - Normal mammogram history. Due now and plans to schedule soon.  Normal breast exam today.  Screening for colon cancer - Has not had screening colonoscopy. No family history of personal risk factors. Recommend Cologuard or colonoscopy.   Return in 1 year for annual.      Tamela Gammon DNP, 3:54 PM 10/15/2021

## 2021-10-23 ENCOUNTER — Other Ambulatory Visit: Payer: No Typology Code available for payment source

## 2022-10-20 ENCOUNTER — Ambulatory Visit: Payer: No Typology Code available for payment source | Admitting: Nurse Practitioner

## 2022-12-07 ENCOUNTER — Other Ambulatory Visit: Payer: Self-pay | Admitting: Nurse Practitioner

## 2022-12-07 DIAGNOSIS — Z3041 Encounter for surveillance of contraceptive pills: Secondary | ICD-10-CM

## 2022-12-08 NOTE — Telephone Encounter (Signed)
Med refill request: Low-ogestrel Last AEX: 10/15/21 Next AEX: 12/24/22 Last MMG (if hormonal med) 10/09/20 Refill authorized: Please Advise, #84, 3 RF

## 2022-12-14 ENCOUNTER — Other Ambulatory Visit: Payer: Self-pay | Admitting: Nurse Practitioner

## 2022-12-14 DIAGNOSIS — F419 Anxiety disorder, unspecified: Secondary | ICD-10-CM

## 2022-12-16 NOTE — Telephone Encounter (Signed)
Med refill request: escitalopram 10 mg tab PO daily Last AEX: 10/15/21 -TW Next AEX: 12/24/22 -TW Last MMG (if hormonal med) N/A  Rx pended #30/0RF  Refill authorized: Please Advise?

## 2022-12-24 ENCOUNTER — Encounter: Payer: Self-pay | Admitting: Nurse Practitioner

## 2022-12-24 ENCOUNTER — Other Ambulatory Visit (HOSPITAL_COMMUNITY)
Admission: RE | Admit: 2022-12-24 | Discharge: 2022-12-24 | Disposition: A | Discharge status: Home / Self Care | Payer: No Typology Code available for payment source | Source: Ambulatory Visit | Attending: Nurse Practitioner | Admitting: Nurse Practitioner

## 2022-12-24 ENCOUNTER — Ambulatory Visit (INDEPENDENT_AMBULATORY_CARE_PROVIDER_SITE_OTHER): Payer: No Typology Code available for payment source | Admitting: Nurse Practitioner

## 2022-12-24 VITALS — BP 122/80 | HR 61 | Ht 63.0 in | Wt 129.0 lb

## 2022-12-24 DIAGNOSIS — G43009 Migraine without aura, not intractable, without status migrainosus: Secondary | ICD-10-CM

## 2022-12-24 DIAGNOSIS — Z124 Encounter for screening for malignant neoplasm of cervix: Secondary | ICD-10-CM | POA: Diagnosis present

## 2022-12-24 DIAGNOSIS — B009 Herpesviral infection, unspecified: Secondary | ICD-10-CM

## 2022-12-24 DIAGNOSIS — Z01419 Encounter for gynecological examination (general) (routine) without abnormal findings: Secondary | ICD-10-CM

## 2022-12-24 DIAGNOSIS — F419 Anxiety disorder, unspecified: Secondary | ICD-10-CM

## 2022-12-24 DIAGNOSIS — Z3041 Encounter for surveillance of contraceptive pills: Secondary | ICD-10-CM

## 2022-12-24 MED ORDER — VALACYCLOVIR HCL 500 MG PO TABS
ORAL_TABLET | ORAL | 0 refills | Status: DC
Start: 2022-12-24 — End: 2023-01-19

## 2022-12-24 MED ORDER — ESCITALOPRAM OXALATE 10 MG PO TABS: 10.0000 mg | ORAL_TABLET | Freq: Every day | ORAL | 3 refills | Status: DC

## 2022-12-24 MED ORDER — SUMATRIPTAN SUCCINATE 50 MG PO TABS: 50.0000 mg | ORAL_TABLET | ORAL | 5 refills | Status: DC | PRN

## 2022-12-24 MED ORDER — LOW-OGESTREL 0.3-30 MG-MCG PO TABS
1.0000 | ORAL_TABLET | Freq: Every day | ORAL | 3 refills | Status: DC
Start: 2022-12-24 — End: 2024-01-12

## 2022-12-24 MED ORDER — ALPRAZOLAM 0.25 MG PO TABS
0.2500 mg | ORAL_TABLET | Freq: Every evening | ORAL | 1 refills | Status: DC | PRN
Start: 2022-12-24 — End: 2024-02-15

## 2022-12-24 NOTE — Progress Notes (Signed)
Tiffany Reilly 1975/06/21 [id number]   History:  47 y.o. G0 presents for annual exam. Monthly cycle on OCPs. Normal pap and mammogram history. History of HSV, occasional outbreaks. Takes xanax rarely to help with insomnia due to anxiety. Anxiety managed well with Lexapro. Imitrex for migraines, dose increase last year and working well. Dog and mother passed this year.   Gynecologic History Patient's last menstrual period was 12/10/2022. Period Cycle (Days): 28 Period Duration (Days): 5 Period Pattern: Regular Menstrual Flow: Moderate Menstrual Control: Maxi pad Menstrual Control Change Freq (Hours): 4-6 Dysmenorrhea: (!) Mild Dysmenorrhea Symptoms: Cramping, Headache Contraception/Family planning: abstinence and OCP (estrogen/progesterone) Sexually active: No  Health Maintenance Last Pap: 09/23/2017. Results were: Normal neg HPV, 5-year repeat Last mammogram: 04/09/2022. Results were: Normal Last colonoscopy: Never Last Dexa: Not indicated  Past medical history, past surgical history, family history and social history were all reviewed and documented in the EPIC chart. Single. Works from home for [employer].   ROS:  A ROS was performed and pertinent positives and negatives are included.  Exam:  Vitals:   12/24/22 1602  BP: 122/80  Pulse: 61  SpO2: 100%  Weight: 129 lb (58.5 kg)  Height: 5\' 3"  (1.6 m)     Body mass index is 22.85 kg/m.  General appearance:  Normal Thyroid:  Symmetrical, normal in size, without palpable masses or nodularity. Respiratory  Auscultation:  Clear without wheezing or rhonchi Cardiovascular  Auscultation:  Regular rate, without rubs, murmurs or gallops  Edema/varicosities:  Not grossly evident Abdominal  Soft,nontender, without masses, guarding or rebound.  Liver/spleen:  No organomegaly noted  Hernia:  None appreciated  Skin  Inspection:  Grossly normal Breasts: Examined lying and sitting.   Right: Without masses,  retractions, nipple discharge or axillary adenopathy.   Left: Without masses, retractions, nipple discharge or axillary adenopathy. Genitourinary   Inguinal/mons:  Normal without inguinal adenopathy  External genitalia:  Normal appearing vulva with no masses, tenderness, or lesions  BUS/Urethra/Skene's glands:  Normal  Vagina:  Normal appearing with normal color and discharge, no lesions  Cervix:  Normal appearing without discharge or lesions  Uterus:  Normal in size, shape and contour.  Midline and mobile, nontender  Adnexa/parametria:     Rt: Normal in size, without masses or tenderness.   Lt: Normal in size, without masses or tenderness.  Anus and perineum: Normal  Digital rectal exam: Not indicated  Patient informed chaperone available to be present for breast and pelvic exam. Patient has requested no chaperone to be present. Patient has been advised what will be completed during breast and pelvic exam.   Assessment/Plan:  47 y.o. G0 for annual exam.   Well female exam with routine gynecological exam - Plan: CBC with Differential/Platelet, Comprehensive metabolic panel, Lipid panel. Education provided on SBEs, importance of preventative screenings, current guidelines, high calcium diet, regular exercise, and multivitamin daily.  Will return for fasting labs.   Encounter for surveillance of contraceptive pills - Plan: norgestrel-ethinyl estradiol (LOW-OGESTREL) 0.3-30 MG-MCG tablet daily. Taking as prescribed. Refill x 1 year provided.   Anxiety - Plan: escitalopram (LEXAPRO) 10 MG tablet daily. Doing well on this and wants to continue.   Migraine without aura and without status migrainosus, not intractable - Plan: SUMAtriptan (IMITREX) 50 MG tablet at first sign of headache. May repeat in 2 hours if persists or recurs.   HSV (herpes simplex virus) infection - Plan: valACYclovir (VALTREX) 500 MG tablet as needed for outbreaks.   Screening for cervical cancer -  Plan: Cytology - PAP(  [place]). Normal pap history.   Screening for breast cancer - Normal mammogram history. Continue annual screenings. Normal breast exam today.  Screening for colon cancer - Has not had screening colonoscopy. No family history of personal risk factors. Recommend Cologuard or colonoscopy.   Return in 1 year for annual.      Olivia Mackie DNP, 4:22 PM 12/24/2022

## 2022-12-26 LAB — CYTOLOGY - PAP
Comment: NEGATIVE
Diagnosis: NEGATIVE
High risk HPV: NEGATIVE

## 2023-01-01 ENCOUNTER — Other Ambulatory Visit: Payer: No Typology Code available for payment source

## 2023-01-01 DIAGNOSIS — Z01419 Encounter for gynecological examination (general) (routine) without abnormal findings: Secondary | ICD-10-CM

## 2023-01-02 LAB — LIPID PANEL
Cholesterol: 193 mg/dL (ref <200)
HDL: 64 mg/dL (ref >=50)
LDL Cholesterol (Calc): 115 mg/dL — ABNORMAL HIGH
Non-HDL Cholesterol (Calc): 129 mg/dL (calc) (ref <130)
Total CHOL/HDL Ratio: 3 (calc) (ref <5.0)
Triglycerides: 54 mg/dL (ref <150)

## 2023-01-02 LAB — CBC WITH DIFFERENTIAL/PLATELET
Absolute Monocytes: 358 {cells}/uL (ref 200–950)
Basophils Absolute: 59 {cells}/uL (ref 0–200)
Basophils Relative: 0.9 %
Eosinophils Absolute: 98 {cells}/uL (ref 15–500)
Eosinophils Relative: 1.5 %
HCT: 40.2 % (ref 35.0–45.0)
Hemoglobin: 13.5 g/dL (ref 11.7–15.5)
Lymphs Abs: 1963 {cells}/uL (ref 850–3900)
MCH: 29 pg (ref 27.0–33.0)
MCHC: 33.6 g/dL (ref 32.0–36.0)
MCV: 86.5 fL (ref 80.0–100.0)
MPV: 10.3 fL (ref 7.5–12.5)
Monocytes Relative: 5.5 %
Neutro Abs: 4024 {cells}/uL (ref 1500–7800)
Neutrophils Relative %: 61.9 %
Platelets: 193 10*3/uL (ref 140–400)
RBC: 4.65 10*6/uL (ref 3.80–5.10)
RDW: 12.7 % (ref 11.0–15.0)
Total Lymphocyte: 30.2 %
WBC: 6.5 10*3/uL (ref 3.8–10.8)

## 2023-01-02 LAB — COMPREHENSIVE METABOLIC PANEL
AG Ratio: 1.8 (calc) (ref 1.0–2.5)
ALT: 8 U/L (ref 6–29)
AST: 13 U/L (ref 10–35)
Albumin: 4.4 g/dL (ref 3.6–5.1)
Alkaline phosphatase (APISO): 55 U/L (ref 31–125)
BUN/Creatinine Ratio: 15 (calc) (ref 6–22)
BUN: 18 mg/dL (ref 7–25)
CO2: 27 mmol/L (ref 20–32)
Calcium: 9.8 mg/dL (ref 8.6–10.2)
Chloride: 104 mmol/L (ref 98–110)
Creat: 1.17 mg/dL — ABNORMAL HIGH (ref 0.50–0.99)
Globulin: 2.5 g/dL (ref 1.9–3.7)
Glucose, Bld: 87 mg/dL (ref 65–99)
Potassium: 4.4 mmol/L (ref 3.5–5.3)
Sodium: 140 mmol/L (ref 135–146)
Total Bilirubin: 0.4 mg/dL (ref 0.2–1.2)
Total Protein: 6.9 g/dL (ref 6.1–8.1)

## 2023-01-17 ENCOUNTER — Other Ambulatory Visit: Payer: Self-pay | Admitting: Nurse Practitioner

## 2023-01-17 DIAGNOSIS — B009 Herpesviral infection, unspecified: Secondary | ICD-10-CM

## 2023-01-19 NOTE — Telephone Encounter (Signed)
Med refill request: Valtrex Last AEX: 01/01/23 Next AEX:not scheduled Last MMG (if hormonal med) 04/09/22 Refill authorized: Please Advise, #[id number], 1 RF

## 2023-04-15 ENCOUNTER — Other Ambulatory Visit: Payer: Self-pay | Admitting: Medical Genetics

## 2023-04-23 ENCOUNTER — Encounter: Payer: Self-pay | Admitting: Nurse Practitioner

## 2023-05-04 ENCOUNTER — Encounter: Payer: Self-pay | Admitting: Nurse Practitioner

## 2023-05-04 ENCOUNTER — Telehealth: Payer: Self-pay

## 2023-05-04 NOTE — Telephone Encounter (Signed)
[facility] calling to request rt breast dx mammo/US order prior to scheduling the pt. She had an inconclusive on 04/23/2023.   AEX UTD.  Order placed on provider's desk for authorization.   Order signed by provider and faxed successfully to [phone].   Routing to provider for final review and encounter closed.

## 2023-05-25 ENCOUNTER — Other Ambulatory Visit (HOSPITAL_COMMUNITY): Payer: Self-pay

## 2023-07-03 LAB — COLOGUARD: COLOGUARD: NEGATIVE

## 2023-07-31 ENCOUNTER — Other Ambulatory Visit (HOSPITAL_COMMUNITY): Payer: Self-pay

## 2023-08-14 ENCOUNTER — Other Ambulatory Visit (HOSPITAL_COMMUNITY): Payer: Self-pay

## 2023-09-11 ENCOUNTER — Other Ambulatory Visit (HOSPITAL_COMMUNITY): Payer: Self-pay

## 2023-09-21 ENCOUNTER — Other Ambulatory Visit (HOSPITAL_COMMUNITY)
Admission: RE | Admit: 2023-09-21 | Discharge: 2023-09-21 | Disposition: A | Discharge status: Home / Self Care | Payer: Self-pay | Source: Ambulatory Visit | Attending: Oncology | Admitting: Oncology

## 2023-10-06 LAB — GENECONNECT MOLECULAR SCREEN: Genetic Analysis Overall Interpretation: NEGATIVE

## 2023-11-11 ENCOUNTER — Encounter: Payer: Self-pay | Admitting: Nurse Practitioner

## 2023-11-12 NOTE — Telephone Encounter (Signed)
 See Dx MMG results in Care Everywhere dated 05/07/23  Order printed to be signed and faxed

## 2023-11-13 NOTE — Telephone Encounter (Signed)
 Order was faxed to [facility]. Message sent to patient on mychart letting her know.

## 2023-11-13 NOTE — Telephone Encounter (Signed)
 Order signed. Thanks.

## 2023-11-18 ENCOUNTER — Encounter: Payer: Self-pay | Admitting: Nurse Practitioner

## 2024-01-12 ENCOUNTER — Other Ambulatory Visit: Payer: Self-pay | Admitting: Nurse Practitioner

## 2024-01-12 DIAGNOSIS — Z3041 Encounter for surveillance of contraceptive pills: Secondary | ICD-10-CM

## 2024-01-12 NOTE — Telephone Encounter (Signed)
 Med refill request:  CRYSELLE -28 0.3-30 MG-MCG tablet - #84 with 3 refills.  Last filled: 10/22/23  Last AEX: 12/24/22 Next AEX: Not yet scheduled Last MMG (if hormonal med): 04/23/23 Refill authorized? Please Advise.

## 2024-01-13 HISTORY — PX: KNEE ARTHROSCOPY: SUR90

## 2024-01-26 ENCOUNTER — Other Ambulatory Visit: Payer: Self-pay | Admitting: Nurse Practitioner

## 2024-01-26 DIAGNOSIS — F419 Anxiety disorder, unspecified: Secondary | ICD-10-CM

## 2024-01-26 NOTE — Telephone Encounter (Signed)
.  Med refill request: Lexapro   Last AEX: 12/24/22 Next AEX: Not scheduled sent a message to scheduling team  Last MMG (if hormonal med) 11/18/23 Refill authorized: Please Advise?

## 2024-02-02 ENCOUNTER — Other Ambulatory Visit: Payer: Self-pay | Admitting: Nurse Practitioner

## 2024-02-02 DIAGNOSIS — Z3041 Encounter for surveillance of contraceptive pills: Secondary | ICD-10-CM

## 2024-02-02 NOTE — Telephone Encounter (Signed)
 Medication refill request: cryselle  Last AEX:  12-24-22 bilateral & rt breast 11-18-23 Next AEX: not scheduled Last MMG (if hormonal medication request): 04-23-23  Refill authorized: pharmacy requesting 90 day supply. Patient has not scheduled an aex. Message sent to scheduling department to call her. Please approve or deny as appropriate

## 2024-02-15 ENCOUNTER — Encounter: Payer: Self-pay | Admitting: Nurse Practitioner

## 2024-02-15 ENCOUNTER — Ambulatory Visit (INDEPENDENT_AMBULATORY_CARE_PROVIDER_SITE_OTHER): Admitting: Nurse Practitioner

## 2024-02-15 VITALS — BP 116/80 | HR 77 | Ht 63.25 in | Wt 136.0 lb

## 2024-02-15 DIAGNOSIS — Z1331 Encounter for screening for depression: Secondary | ICD-10-CM

## 2024-02-15 DIAGNOSIS — G43009 Migraine without aura, not intractable, without status migrainosus: Secondary | ICD-10-CM | POA: Diagnosis not present

## 2024-02-15 DIAGNOSIS — F419 Anxiety disorder, unspecified: Secondary | ICD-10-CM

## 2024-02-15 DIAGNOSIS — Z01419 Encounter for gynecological examination (general) (routine) without abnormal findings: Secondary | ICD-10-CM | POA: Diagnosis not present

## 2024-02-15 DIAGNOSIS — B009 Herpesviral infection, unspecified: Secondary | ICD-10-CM

## 2024-02-15 DIAGNOSIS — N951 Menopausal and female climacteric states: Secondary | ICD-10-CM

## 2024-02-15 DIAGNOSIS — Z3041 Encounter for surveillance of contraceptive pills: Secondary | ICD-10-CM

## 2024-02-15 MED ORDER — ALPRAZOLAM 0.25 MG PO TABS: 0.2500 mg | ORAL_TABLET | Freq: Every evening | ORAL | 1 refills | Status: AC | PRN

## 2024-02-15 MED ORDER — CRYSELLE-28 0.3-30 MG-MCG PO TABS: 1.0000 | ORAL_TABLET | Freq: Every day | ORAL | 3 refills | Status: AC

## 2024-02-15 MED ORDER — ESCITALOPRAM OXALATE 10 MG PO TABS: 10.0000 mg | ORAL_TABLET | Freq: Every day | ORAL | 3 refills | Status: AC

## 2024-02-15 MED ORDER — VALACYCLOVIR HCL 500 MG PO TABS: ORAL_TABLET | ORAL | 1 refills | Status: AC

## 2024-02-15 MED ORDER — SUMATRIPTAN SUCCINATE 100 MG PO TABS: 100.0000 mg | ORAL_TABLET | ORAL | 5 refills | Status: AC | PRN

## 2024-02-15 NOTE — Progress Notes (Signed)
 Tiffany Reilly 1976-04-20 [id number]   History:  48 y.o. G0 presents for annual exam. Monthly cycle on OCPs. Has started to notice changes in menstrual bleeding time of ~5 days instead of 7-8, flow is the same. Having some irritability and mild night sweats. Interested in HRT but not at this time. Has also been having more migraines, about ~5-8 days per month, intermittently. Feels current dose of Imitrex  is not working as well. Normal pap history. History of HSV, occasional outbreaks. Takes xanax  rarely to help with insomnia due to anxiety. Anxiety managed well with Lexapro . Taking high dose Vit D supplement, managed by PCP.   Gynecologic History Patient's last menstrual period was 01/24/2024 (exact date). Period Duration (Days): 3-4 Period Pattern: Regular Menstrual Flow: Moderate Menstrual Control: Maxi pad Dysmenorrhea: (!) Mild Dysmenorrhea Symptoms: Cramping, Headache Contraception/Family planning: abstinence and OCP (estrogen/progesterone) Sexually active: No  Health Maintenance Last Pap: 12/24/2022. Results were: Normal neg HPV Last mammogram: 11/18/2023. Results were: Stable right breast calcifications Last colonoscopy: Never. Neg Cologuard 06/2023 Last Dexa: Not indicated     02/15/2024    2:00 PM  Depression screen PHQ 2/9  Decreased Interest 2  PHQ - 2 Score 2  Altered sleeping 0  Tired, decreased energy 1  Change in appetite 0  Feeling bad or failure about yourself  1  Trouble concentrating 1  Moving slowly or fidgety/restless 0  Suicidal thoughts 0  PHQ-9 Score 5  Difficult doing work/chores Somewhat difficult     Past medical history, past surgical history, family history and social history were all reviewed and documented in the EPIC chart. Single. Works from home for [employer].   ROS:  A ROS was performed and pertinent positives and negatives are included.  Exam:  Vitals:   02/15/24 1356  BP: 116/80  Pulse: 77  SpO2: 99%  Weight: 136 lb (61.7 kg)   Height: 5' 3.25 (1.607 m)      Body mass index is 23.9 kg/m.  General appearance:  Normal Thyroid:  Symmetrical, normal in size, without palpable masses or nodularity. Respiratory  Auscultation:  Clear without wheezing or rhonchi Cardiovascular  Auscultation:  Regular rate, without rubs, murmurs or gallops  Edema/varicosities:  Not grossly evident Abdominal  Soft,nontender, without masses, guarding or rebound.  Liver/spleen:  No organomegaly noted  Hernia:  None appreciated  Skin  Inspection:  Grossly normal Breasts: Examined lying and sitting.   Right: Without masses, retractions, nipple discharge or axillary adenopathy.   Left: Without masses, retractions, nipple discharge or axillary adenopathy. Pelvic: External genitalia:  no lesions              Urethra:  normal appearing urethra with no masses, tenderness or lesions              Bartholins and Skenes: normal                 Vagina: normal appearing vagina with normal color and discharge, no lesions              Cervix: no lesions Bimanual Exam:  Uterus:  no masses or tenderness              Adnexa: no mass, fullness, tenderness              Rectovaginal: Deferred              Anus:  normal, no lesions  Zada Louder, CMA present as chaperone.   Assessment/Plan:  48 y.o. G0 for annual  exam.   Well female exam with routine gynecological exam - Education provided on SBEs, importance of preventative screenings, current guidelines, high calcium diet, regular exercise, and multivitamin daily.  Labs with PCP.   Encounter for surveillance of contraceptive pills - Plan: norgestrel -ethinyl estradiol  (LOW-OGESTREL ) 0.3-30 MG-MCG tablet daily. Taking as prescribed. Refill x 1 year provided.   Perimenopausal symptoms - starting to have small changes in menses, mild symptoms. Discussed perimenopause versus menopause, symptoms, and management options. Wants to continue COCs for now. May try something OTC.   Anxiety - Plan:  escitalopram  (LEXAPRO ) 10 MG tablet daily. Doing well on this and wants to continue.   Migraine without aura and without status migrainosus, not intractable - Plan: SUMAtriptan  (IMITREX ) 100 MG tablet at first sign of headache. May repeat in 2 hours if persists or recurs. Will increase dose. Recommend neurology referral due to increased frequency. Wants to wait. Could be related to perimenopause.   HSV (herpes simplex virus) infection - Plan: valACYclovir  (VALTREX ) 500 MG tablet as needed for outbreaks.   Screening for cervical cancer - Normal pap history. Will repeat at 5-year interval per guidelines.   Screening for breast cancer - Stable right breast calcifications. Continue annual screenings. Normal breast exam today.  Screening for colon cancer - Negative Cologuard Feb 2025.  No follow-ups on file.      Annabella DELENA Shutter DNP, 2:35 PM 02/15/2024

## 2025-02-15 ENCOUNTER — Ambulatory Visit: Admitting: Nurse Practitioner
